# Patient Record
Sex: Female | Born: 1955 | Race: White | Hispanic: No | Marital: Married
Health system: Southern US, Community
[De-identification: ages and names within clinical notes are randomized; demographics above are authoritative.]

---

## 1998-11-30 ENCOUNTER — Other Ambulatory Visit: Admission: RE | Admit: 1998-11-30 | Discharge: 1998-11-30 | Payer: Self-pay | Admitting: Family Medicine

## 1999-11-25 ENCOUNTER — Emergency Department (HOSPITAL_COMMUNITY): Admission: EM | Admit: 1999-11-25 | Discharge: 1999-11-25 | Payer: Self-pay | Admitting: Emergency Medicine

## 1999-11-30 ENCOUNTER — Ambulatory Visit (HOSPITAL_COMMUNITY): Admission: RE | Admit: 1999-11-30 | Discharge: 1999-11-30 | Payer: Self-pay | Admitting: Psychiatry

## 1999-12-24 ENCOUNTER — Ambulatory Visit (HOSPITAL_COMMUNITY): Admission: RE | Admit: 1999-12-24 | Discharge: 1999-12-24 | Payer: Self-pay | Admitting: Psychiatry

## 2000-01-24 ENCOUNTER — Ambulatory Visit (HOSPITAL_COMMUNITY): Admission: RE | Admit: 2000-01-24 | Discharge: 2000-01-24 | Payer: Self-pay | Admitting: Psychiatry

## 2000-02-18 ENCOUNTER — Ambulatory Visit (HOSPITAL_COMMUNITY): Admission: RE | Admit: 2000-02-18 | Discharge: 2000-02-18 | Payer: Self-pay | Admitting: Psychiatry

## 2000-02-27 ENCOUNTER — Other Ambulatory Visit: Admission: RE | Admit: 2000-02-27 | Discharge: 2000-02-27 | Payer: Self-pay | Admitting: Obstetrics & Gynecology

## 2000-03-20 ENCOUNTER — Ambulatory Visit (HOSPITAL_COMMUNITY): Admission: RE | Admit: 2000-03-20 | Discharge: 2000-03-20 | Payer: Self-pay | Admitting: Psychiatry

## 2000-04-14 ENCOUNTER — Ambulatory Visit (HOSPITAL_COMMUNITY): Admission: RE | Admit: 2000-04-14 | Discharge: 2000-04-14 | Payer: Self-pay | Admitting: Psychiatry

## 2000-04-24 ENCOUNTER — Ambulatory Visit (HOSPITAL_COMMUNITY): Admission: RE | Admit: 2000-04-24 | Discharge: 2000-04-24 | Payer: Self-pay | Admitting: Psychiatry

## 2000-04-24 ENCOUNTER — Other Ambulatory Visit: Admission: RE | Admit: 2000-04-24 | Discharge: 2000-04-24 | Payer: Self-pay | Admitting: Obstetrics and Gynecology

## 2000-05-31 ENCOUNTER — Inpatient Hospital Stay (HOSPITAL_COMMUNITY): Admission: EM | Admit: 2000-05-31 | Discharge: 2000-06-02 | Payer: Self-pay | Admitting: *Deleted

## 2000-07-21 ENCOUNTER — Inpatient Hospital Stay (HOSPITAL_COMMUNITY): Admission: EM | Admit: 2000-07-21 | Discharge: 2000-07-24 | Payer: Self-pay | Admitting: *Deleted

## 2000-07-25 ENCOUNTER — Other Ambulatory Visit (HOSPITAL_COMMUNITY): Admission: RE | Admit: 2000-07-25 | Discharge: 2000-08-04 | Payer: Self-pay | Admitting: Psychiatry

## 2000-09-24 ENCOUNTER — Encounter: Admission: RE | Admit: 2000-09-24 | Discharge: 2000-09-24 | Payer: Self-pay | Admitting: Obstetrics and Gynecology

## 2000-09-24 ENCOUNTER — Encounter: Payer: Self-pay | Admitting: Obstetrics and Gynecology

## 2000-09-24 ENCOUNTER — Other Ambulatory Visit: Admission: RE | Admit: 2000-09-24 | Discharge: 2000-09-24 | Payer: Self-pay | Admitting: Obstetrics and Gynecology

## 2001-03-25 ENCOUNTER — Other Ambulatory Visit: Admission: RE | Admit: 2001-03-25 | Discharge: 2001-03-25 | Payer: Self-pay | Admitting: Obstetrics and Gynecology

## 2002-07-09 ENCOUNTER — Other Ambulatory Visit: Admission: RE | Admit: 2002-07-09 | Discharge: 2002-07-09 | Payer: Self-pay | Admitting: Obstetrics and Gynecology

## 2002-08-02 ENCOUNTER — Other Ambulatory Visit (HOSPITAL_COMMUNITY): Admission: RE | Admit: 2002-08-02 | Discharge: 2002-08-06 | Payer: Self-pay | Admitting: *Deleted

## 2003-02-09 ENCOUNTER — Ambulatory Visit (HOSPITAL_COMMUNITY): Admission: RE | Admit: 2003-02-09 | Discharge: 2003-02-09 | Payer: Self-pay | Admitting: Family Medicine

## 2003-04-13 ENCOUNTER — Encounter: Admission: RE | Admit: 2003-04-13 | Discharge: 2003-04-13 | Payer: Self-pay | Admitting: Family Medicine

## 2003-04-27 ENCOUNTER — Encounter: Admission: RE | Admit: 2003-04-27 | Discharge: 2003-04-27 | Payer: Self-pay | Admitting: Family Medicine

## 2003-05-11 ENCOUNTER — Encounter: Admission: RE | Admit: 2003-05-11 | Discharge: 2003-05-11 | Payer: Self-pay | Admitting: Family Medicine

## 2003-06-30 ENCOUNTER — Ambulatory Visit (HOSPITAL_COMMUNITY): Admission: RE | Admit: 2003-06-30 | Discharge: 2003-06-30 | Payer: Self-pay | Admitting: Internal Medicine

## 2003-10-21 ENCOUNTER — Other Ambulatory Visit: Admission: RE | Admit: 2003-10-21 | Discharge: 2003-10-21 | Payer: Self-pay | Admitting: Obstetrics and Gynecology

## 2004-03-13 ENCOUNTER — Emergency Department (HOSPITAL_COMMUNITY): Admission: EM | Admit: 2004-03-13 | Discharge: 2004-03-13 | Payer: Self-pay | Admitting: Emergency Medicine

## 2004-12-28 ENCOUNTER — Ambulatory Visit: Payer: Self-pay

## 2005-09-12 ENCOUNTER — Encounter: Admission: RE | Admit: 2005-09-12 | Discharge: 2005-09-12 | Payer: Self-pay | Admitting: Radiology

## 2005-09-23 ENCOUNTER — Encounter: Admission: RE | Admit: 2005-09-23 | Discharge: 2005-09-23 | Payer: Self-pay | Admitting: General Surgery

## 2005-09-25 ENCOUNTER — Ambulatory Visit (HOSPITAL_BASED_OUTPATIENT_CLINIC_OR_DEPARTMENT_OTHER): Admission: RE | Admit: 2005-09-25 | Discharge: 2005-09-25 | Payer: Self-pay | Admitting: General Surgery

## 2005-09-25 ENCOUNTER — Encounter (INDEPENDENT_AMBULATORY_CARE_PROVIDER_SITE_OTHER): Payer: Self-pay | Admitting: Specialist

## 2005-10-04 ENCOUNTER — Ambulatory Visit: Payer: Self-pay | Admitting: Oncology

## 2005-10-23 LAB — COMPREHENSIVE METABOLIC PANEL
ALT: 13 U/L (ref 0–40)
AST: 15 U/L (ref 0–37)
Albumin: 4.5 g/dL (ref 3.5–5.2)
BUN: 11 mg/dL (ref 6–23)
Calcium: 9.8 mg/dL (ref 8.4–10.5)
Chloride: 102 mEq/L (ref 96–112)
Potassium: 4 mEq/L (ref 3.5–5.3)
Sodium: 141 mEq/L (ref 135–145)
Total Protein: 7 g/dL (ref 6.0–8.3)

## 2005-10-23 LAB — CBC WITH DIFFERENTIAL/PLATELET
BASO%: 0.3 % (ref 0.0–2.0)
HCT: 41.7 % (ref 34.8–46.6)
HGB: 14.1 g/dL (ref 11.6–15.9)
MCHC: 33.9 g/dL (ref 32.0–36.0)
MONO#: 0.5 10*3/uL (ref 0.1–0.9)
NEUT%: 55.7 % (ref 39.6–76.8)
RDW: 12.4 % (ref 11.3–14.5)
WBC: 5.3 10*3/uL (ref 3.9–10.0)
lymph#: 1.8 10*3/uL (ref 0.9–3.3)

## 2005-10-29 ENCOUNTER — Ambulatory Visit (HOSPITAL_COMMUNITY): Admission: RE | Admit: 2005-10-29 | Discharge: 2005-10-29 | Payer: Self-pay | Admitting: Oncology

## 2005-10-30 ENCOUNTER — Ambulatory Visit (HOSPITAL_COMMUNITY): Admission: RE | Admit: 2005-10-30 | Discharge: 2005-10-30 | Payer: Self-pay | Admitting: Oncology

## 2005-10-31 ENCOUNTER — Encounter (INDEPENDENT_AMBULATORY_CARE_PROVIDER_SITE_OTHER): Payer: Self-pay | Admitting: *Deleted

## 2005-10-31 ENCOUNTER — Ambulatory Visit: Admission: RE | Admit: 2005-10-31 | Discharge: 2005-10-31 | Payer: Self-pay | Admitting: Oncology

## 2005-11-01 ENCOUNTER — Ambulatory Visit (HOSPITAL_BASED_OUTPATIENT_CLINIC_OR_DEPARTMENT_OTHER): Admission: RE | Admit: 2005-11-01 | Discharge: 2005-11-01 | Payer: Self-pay | Admitting: General Surgery

## 2005-11-19 ENCOUNTER — Ambulatory Visit: Payer: Self-pay | Admitting: Oncology

## 2005-11-29 LAB — CBC WITH DIFFERENTIAL/PLATELET
Basophils Absolute: 0 10*3/uL (ref 0.0–0.1)
Eosinophils Absolute: 0.1 10*3/uL (ref 0.0–0.5)
HGB: 13 g/dL (ref 11.6–15.9)
NEUT#: 0.4 10*3/uL — CL (ref 1.5–6.5)
RDW: 12.6 % (ref 11.3–14.5)
WBC: 2.1 10*3/uL — ABNORMAL LOW (ref 3.9–10.0)
lymph#: 1.4 10*3/uL (ref 0.9–3.3)

## 2005-12-06 LAB — CBC WITH DIFFERENTIAL/PLATELET
Basophils Absolute: 0.1 10*3/uL (ref 0.0–0.1)
Eosinophils Absolute: 0.1 10*3/uL (ref 0.0–0.5)
HGB: 14.4 g/dL (ref 11.6–15.9)
LYMPH%: 31.8 % (ref 14.0–48.0)
MCV: 91.4 fL (ref 81.0–101.0)
MONO%: 10.4 % (ref 0.0–13.0)
NEUT#: 3.7 10*3/uL (ref 1.5–6.5)
Platelets: 214 10*3/uL (ref 145–400)
RDW: 11.6 % (ref 11.3–14.5)

## 2005-12-12 LAB — CBC WITH DIFFERENTIAL/PLATELET
Basophils Absolute: 0 10*3/uL (ref 0.0–0.1)
Eosinophils Absolute: 0 10*3/uL (ref 0.0–0.5)
HCT: 35.9 % (ref 34.8–46.6)
LYMPH%: 29 % (ref 14.0–48.0)
MCV: 93.6 fL (ref 81.0–101.0)
MONO%: 0.7 % (ref 0.0–13.0)
NEUT#: 1.7 10*3/uL (ref 1.5–6.5)
NEUT%: 69.6 % (ref 39.6–76.8)
Platelets: 210 10*3/uL (ref 145–400)
RBC: 3.83 10*6/uL (ref 3.70–5.32)

## 2005-12-17 LAB — COMPREHENSIVE METABOLIC PANEL
ALT: 18 U/L (ref 0–40)
CO2: 29 mEq/L (ref 19–32)
Calcium: 9.9 mg/dL (ref 8.4–10.5)
Chloride: 108 mEq/L (ref 96–112)
Creatinine, Ser: 0.89 mg/dL (ref 0.40–1.20)
Glucose, Bld: 108 mg/dL — ABNORMAL HIGH (ref 70–99)
Total Bilirubin: 0.6 mg/dL (ref 0.3–1.2)
Total Protein: 6.7 g/dL (ref 6.0–8.3)

## 2005-12-17 LAB — CBC WITH DIFFERENTIAL/PLATELET
Basophils Absolute: 0 10*3/uL (ref 0.0–0.1)
Eosinophils Absolute: 0 10*3/uL (ref 0.0–0.5)
HCT: 37 % (ref 34.8–46.6)
HGB: 12.7 g/dL (ref 11.6–15.9)
LYMPH%: 14.3 % (ref 14.0–48.0)
MONO#: 0.8 10*3/uL (ref 0.1–0.9)
NEUT#: 9.7 10*3/uL — ABNORMAL HIGH (ref 1.5–6.5)
NEUT%: 78.5 % — ABNORMAL HIGH (ref 39.6–76.8)
Platelets: 319 10*3/uL (ref 145–400)
WBC: 12.3 10*3/uL — ABNORMAL HIGH (ref 3.9–10.0)
lymph#: 1.8 10*3/uL (ref 0.9–3.3)

## 2005-12-20 LAB — CBC WITH DIFFERENTIAL/PLATELET
BASO%: 1.2 % (ref 0.0–2.0)
Eosinophils Absolute: 0 10*3/uL (ref 0.0–0.5)
HCT: 37.4 % (ref 34.8–46.6)
LYMPH%: 24.1 % (ref 14.0–48.0)
MCHC: 35.8 g/dL (ref 32.0–36.0)
MONO#: 0.9 10*3/uL (ref 0.1–0.9)
NEUT#: 4.7 10*3/uL (ref 1.5–6.5)
Platelets: 198 10*3/uL (ref 145–400)
RBC: 4.12 10*6/uL (ref 3.70–5.32)
WBC: 7.5 10*3/uL (ref 3.9–10.0)
lymph#: 1.8 10*3/uL (ref 0.9–3.3)

## 2005-12-27 LAB — CBC WITH DIFFERENTIAL/PLATELET
Basophils Absolute: 0 10*3/uL (ref 0.0–0.1)
Eosinophils Absolute: 0 10*3/uL (ref 0.0–0.5)
HCT: 33.7 % — ABNORMAL LOW (ref 34.8–46.6)
HGB: 11.6 g/dL (ref 11.6–15.9)
LYMPH%: 18.2 % (ref 14.0–48.0)
MONO#: 0 10*3/uL — ABNORMAL LOW (ref 0.1–0.9)
NEUT%: 79.4 % — ABNORMAL HIGH (ref 39.6–76.8)
Platelets: 124 10*3/uL — ABNORMAL LOW (ref 145–400)
WBC: 2.1 10*3/uL — ABNORMAL LOW (ref 3.9–10.0)
lymph#: 0.4 10*3/uL — ABNORMAL LOW (ref 0.9–3.3)

## 2006-01-03 LAB — CBC WITH DIFFERENTIAL/PLATELET
Basophils Absolute: 0.1 10*3/uL (ref 0.0–0.1)
EOS%: 0.2 % (ref 0.0–7.0)
HCT: 36 % (ref 34.8–46.6)
HGB: 12.9 g/dL (ref 11.6–15.9)
LYMPH%: 14.1 % (ref 14.0–48.0)
MCH: 32.7 pg (ref 26.0–34.0)
MCHC: 35.7 g/dL (ref 32.0–36.0)
MCV: 91.4 fL (ref 81.0–101.0)
MONO%: 8.3 % (ref 0.0–13.0)
NEUT%: 76.2 % (ref 39.6–76.8)
Platelets: 204 10*3/uL (ref 145–400)
lymph#: 1 10*3/uL (ref 0.9–3.3)

## 2006-01-04 ENCOUNTER — Ambulatory Visit: Payer: Self-pay | Admitting: Oncology

## 2006-01-06 ENCOUNTER — Ambulatory Visit: Payer: Self-pay | Admitting: Oncology

## 2006-01-07 LAB — CBC WITH DIFFERENTIAL/PLATELET
BASO%: 0 % (ref 0.0–2.0)
EOS%: 0 % (ref 0.0–7.0)
HCT: 34.7 % — ABNORMAL LOW (ref 34.8–46.6)
LYMPH%: 1.2 % — ABNORMAL LOW (ref 14.0–48.0)
MCH: 33.5 pg (ref 26.0–34.0)
MCHC: 34.5 g/dL (ref 32.0–36.0)
NEUT%: 98.7 % — ABNORMAL HIGH (ref 39.6–76.8)
lymph#: 0.6 10*3/uL — ABNORMAL LOW (ref 0.9–3.3)

## 2006-01-17 LAB — CBC WITH DIFFERENTIAL/PLATELET
BASO%: 1.2 % (ref 0.0–2.0)
EOS%: 0.7 % (ref 0.0–7.0)
HGB: 11.8 g/dL (ref 11.6–15.9)
MCH: 33.6 pg (ref 26.0–34.0)
MCHC: 35.7 g/dL (ref 32.0–36.0)
RDW: 15.5 % — ABNORMAL HIGH (ref 11.3–14.5)
lymph#: 0.7 10*3/uL — ABNORMAL LOW (ref 0.9–3.3)

## 2006-02-07 LAB — CBC WITH DIFFERENTIAL/PLATELET
Basophils Absolute: 0.1 10*3/uL (ref 0.0–0.1)
EOS%: 2.1 % (ref 0.0–7.0)
Eosinophils Absolute: 0.2 10*3/uL (ref 0.0–0.5)
HGB: 10.8 g/dL — ABNORMAL LOW (ref 11.6–15.9)
LYMPH%: 9.3 % — ABNORMAL LOW (ref 14.0–48.0)
MCH: 34.1 pg — ABNORMAL HIGH (ref 26.0–34.0)
MCV: 100.3 fL (ref 81.0–101.0)
MONO%: 4.8 % (ref 0.0–13.0)
NEUT#: 8.7 10*3/uL — ABNORMAL HIGH (ref 1.5–6.5)
NEUT%: 82.9 % — ABNORMAL HIGH (ref 39.6–76.8)
Platelets: 177 10*3/uL (ref 145–400)
RDW: 17.8 % — ABNORMAL HIGH (ref 11.3–14.5)

## 2006-02-14 LAB — CBC WITH DIFFERENTIAL/PLATELET
BASO%: 0.8 % (ref 0.0–2.0)
EOS%: 2 % (ref 0.0–7.0)
Eosinophils Absolute: 0.1 10*3/uL (ref 0.0–0.5)
LYMPH%: 10.8 % — ABNORMAL LOW (ref 14.0–48.0)
MCH: 33.4 pg (ref 26.0–34.0)
MCHC: 34.5 g/dL (ref 32.0–36.0)
MCV: 96.7 fL (ref 81.0–101.0)
MONO%: 9.3 % (ref 0.0–13.0)
Platelets: 175 10*3/uL (ref 145–400)
RBC: 3.71 10*6/uL (ref 3.70–5.32)
RDW: 14.5 % (ref 11.3–14.5)

## 2006-02-21 ENCOUNTER — Ambulatory Visit: Payer: Self-pay | Admitting: Oncology

## 2006-02-21 LAB — CBC WITH DIFFERENTIAL/PLATELET
BASO%: 0.1 % (ref 0.0–2.0)
LYMPH%: 4.1 % — ABNORMAL LOW (ref 14.0–48.0)
MCHC: 33.6 g/dL (ref 32.0–36.0)
MONO#: 1.1 10*3/uL — ABNORMAL HIGH (ref 0.1–0.9)
NEUT#: 29.5 10*3/uL — ABNORMAL HIGH (ref 1.5–6.5)
Platelets: 193 10*3/uL (ref 145–400)
RBC: 3.52 10*6/uL — ABNORMAL LOW (ref 3.70–5.32)
RDW: 16.4 % — ABNORMAL HIGH (ref 11.3–14.5)
WBC: 32.2 10*3/uL — ABNORMAL HIGH (ref 3.9–10.0)

## 2006-02-28 LAB — CBC WITH DIFFERENTIAL/PLATELET
BASO%: 0.6 % (ref 0.0–2.0)
Eosinophils Absolute: 0.1 10*3/uL (ref 0.0–0.5)
HCT: 37.1 % (ref 34.8–46.6)
MCHC: 34.3 g/dL (ref 32.0–36.0)
MONO#: 0.6 10*3/uL (ref 0.1–0.9)
NEUT#: 7.1 10*3/uL — ABNORMAL HIGH (ref 1.5–6.5)
NEUT%: 83.5 % — ABNORMAL HIGH (ref 39.6–76.8)
RBC: 3.77 10*6/uL (ref 3.70–5.32)
WBC: 8.5 10*3/uL (ref 3.9–10.0)
lymph#: 0.7 10*3/uL — ABNORMAL LOW (ref 0.9–3.3)

## 2006-03-07 ENCOUNTER — Ambulatory Visit: Admission: RE | Admit: 2006-03-07 | Discharge: 2006-05-26 | Payer: Self-pay | Admitting: Radiation Oncology

## 2006-03-07 LAB — CBC WITH DIFFERENTIAL/PLATELET
BASO%: 0.4 % (ref 0.0–2.0)
HCT: 35.4 % (ref 34.8–46.6)
LYMPH%: 20.2 % (ref 14.0–48.0)
MCH: 33.3 pg (ref 26.0–34.0)
MCHC: 33.7 g/dL (ref 32.0–36.0)
MCV: 99 fL (ref 81.0–101.0)
MONO#: 0.3 10*3/uL (ref 0.1–0.9)
MONO%: 7.4 % (ref 0.0–13.0)
NEUT%: 69.3 % (ref 39.6–76.8)
Platelets: 201 10*3/uL (ref 145–400)

## 2006-04-18 ENCOUNTER — Ambulatory Visit: Payer: Self-pay | Admitting: Oncology

## 2006-04-22 LAB — CBC WITH DIFFERENTIAL/PLATELET
BASO%: 0.2 % (ref 0.0–2.0)
EOS%: 1.3 % (ref 0.0–7.0)
LYMPH%: 30.7 % (ref 14.0–48.0)
MCH: 32.5 pg (ref 26.0–34.0)
MCHC: 34.6 g/dL (ref 32.0–36.0)
MCV: 94.1 fL (ref 81.0–101.0)
MONO%: 9.7 % (ref 0.0–13.0)
NEUT#: 1.8 10*3/uL (ref 1.5–6.5)
Platelets: 222 10*3/uL (ref 145–400)
RBC: 4.6 10*6/uL (ref 3.70–5.32)
RDW: 12.4 % (ref 11.3–14.5)

## 2006-04-22 LAB — COMPREHENSIVE METABOLIC PANEL
AST: 16 U/L (ref 0–37)
Albumin: 4.6 g/dL (ref 3.5–5.2)
Alkaline Phosphatase: 59 U/L (ref 39–117)
Potassium: 4.1 mEq/L (ref 3.5–5.3)
Sodium: 144 mEq/L (ref 135–145)
Total Bilirubin: 0.3 mg/dL (ref 0.3–1.2)
Total Protein: 7.1 g/dL (ref 6.0–8.3)

## 2006-05-06 LAB — ESTRADIOL, ULTRA SENS: Estradiol, Ultra Sensitive: 17 pg/mL

## 2006-05-27 ENCOUNTER — Ambulatory Visit (HOSPITAL_COMMUNITY): Admission: RE | Admit: 2006-05-27 | Discharge: 2006-05-27 | Payer: Self-pay | Admitting: General Surgery

## 2006-05-29 ENCOUNTER — Ambulatory Visit: Admission: RE | Admit: 2006-05-29 | Discharge: 2006-08-27 | Payer: Self-pay | Admitting: Radiation Oncology

## 2006-06-02 ENCOUNTER — Ambulatory Visit: Payer: Self-pay | Admitting: Oncology

## 2006-06-02 LAB — CBC WITH DIFFERENTIAL/PLATELET
BASO%: 0.4 % (ref 0.0–2.0)
EOS%: 1.3 % (ref 0.0–7.0)
HCT: 43.8 % (ref 34.8–46.6)
LYMPH%: 25.4 % (ref 14.0–48.0)
MCH: 31.2 pg (ref 26.0–34.0)
MCHC: 34.6 g/dL (ref 32.0–36.0)
NEUT%: 61 % (ref 39.6–76.8)
Platelets: 224 10*3/uL (ref 145–400)
lymph#: 0.7 10*3/uL — ABNORMAL LOW (ref 0.9–3.3)

## 2006-06-02 LAB — COMPREHENSIVE METABOLIC PANEL
ALT: 18 U/L (ref 0–35)
AST: 17 U/L (ref 0–37)
Creatinine, Ser: 0.84 mg/dL (ref 0.40–1.20)
Total Bilirubin: 0.5 mg/dL (ref 0.3–1.2)

## 2006-06-02 LAB — CANCER ANTIGEN 27.29: CA 27.29: 17 U/mL (ref 0–39)

## 2006-07-11 LAB — ESTRADIOL, ULTRA SENS

## 2006-07-17 ENCOUNTER — Ambulatory Visit: Payer: Self-pay | Admitting: Oncology

## 2006-07-22 LAB — COMPREHENSIVE METABOLIC PANEL
ALT: 14 U/L (ref 0–35)
Alkaline Phosphatase: 61 U/L (ref 39–117)
CO2: 28 mEq/L (ref 19–32)
Creatinine, Ser: 0.85 mg/dL (ref 0.40–1.20)
Total Bilirubin: 0.5 mg/dL (ref 0.3–1.2)

## 2006-07-22 LAB — CBC WITH DIFFERENTIAL/PLATELET
BASO%: 0.3 % (ref 0.0–2.0)
EOS%: 1.4 % (ref 0.0–7.0)
HCT: 43 % (ref 34.8–46.6)
LYMPH%: 35.6 % (ref 14.0–48.0)
MCH: 32.3 pg (ref 26.0–34.0)
MCHC: 35.5 g/dL (ref 32.0–36.0)
MCV: 90.9 fL (ref 81.0–101.0)
MONO%: 10.1 % (ref 0.0–13.0)
NEUT%: 52.6 % (ref 39.6–76.8)
Platelets: 246 10*3/uL (ref 145–400)
RBC: 4.73 10*6/uL (ref 3.70–5.32)

## 2006-07-22 LAB — CANCER ANTIGEN 27.29: CA 27.29: 15 U/mL (ref 0–39)

## 2006-09-18 ENCOUNTER — Ambulatory Visit: Payer: Self-pay | Admitting: Oncology

## 2006-09-22 LAB — CBC WITH DIFFERENTIAL/PLATELET
BASO%: 0.9 % (ref 0.0–2.0)
HCT: 40.6 % (ref 34.8–46.6)
LYMPH%: 26.3 % (ref 14.0–48.0)
MCH: 32 pg (ref 26.0–34.0)
MCHC: 35 g/dL (ref 32.0–36.0)
MCV: 91.4 fL (ref 81.0–101.0)
MONO#: 0.4 10*3/uL (ref 0.1–0.9)
MONO%: 10 % (ref 0.0–13.0)
NEUT%: 60.3 % (ref 39.6–76.8)
Platelets: 237 10*3/uL (ref 145–400)

## 2006-09-22 LAB — COMPREHENSIVE METABOLIC PANEL
ALT: 18 U/L (ref 0–35)
Alkaline Phosphatase: 49 U/L (ref 39–117)
CO2: 26 mEq/L (ref 19–32)
Creatinine, Ser: 0.79 mg/dL (ref 0.40–1.20)
Total Bilirubin: 0.4 mg/dL (ref 0.3–1.2)

## 2006-09-22 LAB — CANCER ANTIGEN 27.29: CA 27.29: 16 U/mL (ref 0–39)

## 2006-09-22 LAB — FOLLICLE STIMULATING HORMONE: FSH: 61.6 m[IU]/mL

## 2006-10-13 ENCOUNTER — Ambulatory Visit: Payer: Self-pay | Admitting: Psychiatry

## 2006-10-22 ENCOUNTER — Ambulatory Visit: Payer: Self-pay | Admitting: Psychiatry

## 2006-11-03 ENCOUNTER — Ambulatory Visit: Payer: Self-pay | Admitting: Psychiatry

## 2006-11-11 ENCOUNTER — Ambulatory Visit: Payer: Self-pay | Admitting: Oncology

## 2007-01-19 ENCOUNTER — Ambulatory Visit: Payer: Self-pay | Admitting: Oncology

## 2007-01-29 ENCOUNTER — Encounter: Admission: RE | Admit: 2007-01-29 | Discharge: 2007-03-30 | Payer: Self-pay | Admitting: Oncology

## 2007-03-26 ENCOUNTER — Ambulatory Visit: Payer: Self-pay | Admitting: Oncology

## 2007-04-08 LAB — CBC WITH DIFFERENTIAL/PLATELET
BASO%: 0.3 % (ref 0.0–2.0)
EOS%: 1.5 % (ref 0.0–7.0)
MCH: 32.1 pg (ref 26.0–34.0)
MCV: 93.8 fL (ref 81.0–101.0)
MONO%: 8.5 % (ref 0.0–13.0)
RBC: 4.47 10*6/uL (ref 3.70–5.32)
RDW: 13 % (ref 11.3–14.5)

## 2007-04-09 LAB — COMPREHENSIVE METABOLIC PANEL
AST: 26 U/L (ref 0–37)
Albumin: 4.6 g/dL (ref 3.5–5.2)
Alkaline Phosphatase: 51 U/L (ref 39–117)
BUN: 12 mg/dL (ref 6–23)
Potassium: 4.5 mEq/L (ref 3.5–5.3)
Sodium: 142 mEq/L (ref 135–145)
Total Protein: 7.3 g/dL (ref 6.0–8.3)

## 2007-09-18 ENCOUNTER — Emergency Department (HOSPITAL_COMMUNITY): Admission: EM | Admit: 2007-09-18 | Discharge: 2007-09-19 | Payer: Self-pay | Admitting: Emergency Medicine

## 2007-09-18 ENCOUNTER — Ambulatory Visit: Payer: Self-pay | Admitting: Oncology

## 2007-09-18 ENCOUNTER — Ambulatory Visit (HOSPITAL_COMMUNITY): Admission: RE | Admit: 2007-09-18 | Discharge: 2007-09-18 | Payer: Self-pay | Admitting: Oncology

## 2007-09-18 LAB — COMPREHENSIVE METABOLIC PANEL
Albumin: 4 g/dL (ref 3.5–5.2)
BUN: 14 mg/dL (ref 6–23)
CO2: 29 mEq/L (ref 19–32)
Calcium: 8.9 mg/dL (ref 8.4–10.5)
Chloride: 102 mEq/L (ref 96–112)
Glucose, Bld: 98 mg/dL (ref 70–99)
Potassium: 4.1 mEq/L (ref 3.5–5.3)
Sodium: 139 mEq/L (ref 135–145)
Total Protein: 6.6 g/dL (ref 6.0–8.3)

## 2007-09-18 LAB — CBC WITH DIFFERENTIAL/PLATELET
Basophils Absolute: 0 10*3/uL (ref 0.0–0.1)
Eosinophils Absolute: 0 10*3/uL (ref 0.0–0.5)
HGB: 14.2 g/dL (ref 11.6–15.9)
MONO#: 0.2 10*3/uL (ref 0.1–0.9)
NEUT#: 1.9 10*3/uL (ref 1.5–6.5)
RBC: 4.38 10*6/uL (ref 3.70–5.32)
RDW: 13.6 % (ref 11.3–14.5)
WBC: 3.4 10*3/uL — ABNORMAL LOW (ref 3.9–10.0)

## 2007-10-01 ENCOUNTER — Encounter: Admission: RE | Admit: 2007-10-01 | Discharge: 2007-10-21 | Payer: Self-pay | Admitting: Oncology

## 2007-11-25 ENCOUNTER — Ambulatory Visit: Payer: Self-pay | Admitting: Psychiatry

## 2007-12-01 ENCOUNTER — Ambulatory Visit: Payer: Self-pay | Admitting: Psychiatry

## 2007-12-22 ENCOUNTER — Ambulatory Visit: Payer: Self-pay | Admitting: Psychiatry

## 2008-01-05 ENCOUNTER — Ambulatory Visit: Payer: Self-pay | Admitting: Psychiatry

## 2008-05-16 ENCOUNTER — Ambulatory Visit: Payer: Self-pay | Admitting: Psychiatry

## 2008-07-14 ENCOUNTER — Ambulatory Visit: Payer: Self-pay | Admitting: Oncology

## 2008-07-18 LAB — CBC WITH DIFFERENTIAL/PLATELET
Basophils Absolute: 0 10*3/uL (ref 0.0–0.1)
EOS%: 0.9 % (ref 0.0–7.0)
Eosinophils Absolute: 0 10*3/uL (ref 0.0–0.5)
HCT: 41.6 % (ref 34.8–46.6)
HGB: 14.2 g/dL (ref 11.6–15.9)
MCH: 32.7 pg (ref 25.1–34.0)
MCV: 95.4 fL (ref 79.5–101.0)
MONO%: 7.6 % (ref 0.0–14.0)
NEUT#: 1.8 10*3/uL (ref 1.5–6.5)
NEUT%: 54.5 % (ref 38.4–76.8)
RDW: 13.3 % (ref 11.2–14.5)

## 2008-07-18 LAB — COMPREHENSIVE METABOLIC PANEL
AST: 16 U/L (ref 0–37)
Albumin: 4.4 g/dL (ref 3.5–5.2)
Alkaline Phosphatase: 54 U/L (ref 39–117)
BUN: 13 mg/dL (ref 6–23)
Calcium: 9.4 mg/dL (ref 8.4–10.5)
Chloride: 108 mEq/L (ref 96–112)
Creatinine, Ser: 0.82 mg/dL (ref 0.40–1.20)
Glucose, Bld: 90 mg/dL (ref 70–99)
Potassium: 4.1 mEq/L (ref 3.5–5.3)

## 2008-07-20 ENCOUNTER — Ambulatory Visit (HOSPITAL_COMMUNITY): Admission: RE | Admit: 2008-07-20 | Discharge: 2008-07-20 | Payer: Self-pay | Admitting: Oncology

## 2008-07-26 ENCOUNTER — Ambulatory Visit (HOSPITAL_COMMUNITY): Admission: RE | Admit: 2008-07-26 | Discharge: 2008-07-26 | Payer: Self-pay | Admitting: Oncology

## 2008-08-08 ENCOUNTER — Ambulatory Visit (HOSPITAL_COMMUNITY): Admission: RE | Admit: 2008-08-08 | Discharge: 2008-08-08 | Payer: Self-pay | Admitting: Oncology

## 2008-08-08 ENCOUNTER — Encounter (INDEPENDENT_AMBULATORY_CARE_PROVIDER_SITE_OTHER): Payer: Self-pay | Admitting: Interventional Radiology

## 2008-09-01 ENCOUNTER — Ambulatory Visit: Payer: Self-pay | Admitting: Oncology

## 2008-09-02 ENCOUNTER — Ambulatory Visit (HOSPITAL_COMMUNITY): Admission: RE | Admit: 2008-09-02 | Discharge: 2008-09-02 | Payer: Self-pay | Admitting: Oncology

## 2008-09-06 LAB — CBC WITH DIFFERENTIAL/PLATELET
BASO%: 0.3 % (ref 0.0–2.0)
EOS%: 1.5 % (ref 0.0–7.0)
HCT: 41.2 % (ref 34.8–46.6)
MCH: 32.8 pg (ref 25.1–34.0)
MCHC: 34.5 g/dL (ref 31.5–36.0)
MCV: 95.2 fL (ref 79.5–101.0)
MONO%: 10.8 % (ref 0.0–14.0)
NEUT%: 54.9 % (ref 38.4–76.8)
lymph#: 1.1 10*3/uL (ref 0.9–3.3)

## 2008-09-07 LAB — COMPREHENSIVE METABOLIC PANEL
Alkaline Phosphatase: 57 U/L (ref 39–117)
Glucose, Bld: 61 mg/dL — ABNORMAL LOW (ref 70–99)
Sodium: 142 mEq/L (ref 135–145)
Total Bilirubin: 0.3 mg/dL (ref 0.3–1.2)
Total Protein: 7.1 g/dL (ref 6.0–8.3)

## 2008-09-09 ENCOUNTER — Ambulatory Visit (HOSPITAL_COMMUNITY): Admission: RE | Admit: 2008-09-09 | Discharge: 2008-09-09 | Payer: Self-pay | Admitting: Oncology

## 2008-09-19 ENCOUNTER — Ambulatory Visit (HOSPITAL_COMMUNITY): Admission: RE | Admit: 2008-09-19 | Discharge: 2008-09-19 | Payer: Self-pay | Admitting: Oncology

## 2008-09-20 LAB — COMPREHENSIVE METABOLIC PANEL
BUN: 8 mg/dL (ref 6–23)
CO2: 28 mEq/L (ref 19–32)
Creatinine, Ser: 0.84 mg/dL (ref 0.40–1.20)
Glucose, Bld: 83 mg/dL (ref 70–99)
Total Bilirubin: 0.4 mg/dL (ref 0.3–1.2)

## 2008-09-20 LAB — CBC WITH DIFFERENTIAL/PLATELET
BASO%: 1.3 % (ref 0.0–2.0)
Eosinophils Absolute: 0 10*3/uL (ref 0.0–0.5)
HCT: 38.2 % (ref 34.8–46.6)
LYMPH%: 70.9 % — ABNORMAL HIGH (ref 14.0–49.7)
MCHC: 34 g/dL (ref 31.5–36.0)
MONO#: 0 10*3/uL — ABNORMAL LOW (ref 0.1–0.9)
NEUT%: 22.7 % — ABNORMAL LOW (ref 38.4–76.8)
Platelets: 138 10*3/uL — ABNORMAL LOW (ref 145–400)
WBC: 0.8 10*3/uL — CL (ref 3.9–10.3)

## 2008-09-23 LAB — CBC WITH DIFFERENTIAL/PLATELET
Eosinophils Absolute: 0 10*3/uL (ref 0.0–0.5)
HCT: 35.6 % (ref 34.8–46.6)
HGB: 12.7 g/dL (ref 11.6–15.9)
LYMPH%: 67.4 % — ABNORMAL HIGH (ref 14.0–49.7)
MONO#: 0.1 10*3/uL (ref 0.1–0.9)
NEUT#: 0.4 10*3/uL — CL (ref 1.5–6.5)
NEUT%: 22.9 % — ABNORMAL LOW (ref 38.4–76.8)
Platelets: 205 10*3/uL (ref 145–400)
WBC: 1.8 10*3/uL — ABNORMAL LOW (ref 3.9–10.3)

## 2008-10-04 ENCOUNTER — Ambulatory Visit: Payer: Self-pay | Admitting: Oncology

## 2008-10-04 LAB — CBC WITH DIFFERENTIAL/PLATELET
Basophils Absolute: 0 10*3/uL (ref 0.0–0.1)
Eosinophils Absolute: 0 10*3/uL (ref 0.0–0.5)
HCT: 41.5 % (ref 34.8–46.6)
HGB: 14.1 g/dL (ref 11.6–15.9)
LYMPH%: 36 % (ref 14.0–49.7)
MCHC: 34 g/dL (ref 31.5–36.0)
MONO#: 0.6 10*3/uL (ref 0.1–0.9)
NEUT#: 2.4 10*3/uL (ref 1.5–6.5)
NEUT%: 50.7 % (ref 38.4–76.8)
Platelets: 267 10*3/uL (ref 145–400)
WBC: 4.6 10*3/uL (ref 3.9–10.3)
lymph#: 1.7 10*3/uL (ref 0.9–3.3)

## 2008-10-12 LAB — CBC WITH DIFFERENTIAL/PLATELET
BASO%: 0.2 % (ref 0.0–2.0)
Eosinophils Absolute: 0.1 10*3/uL (ref 0.0–0.5)
HCT: 38.3 % (ref 34.8–46.6)
LYMPH%: 14.4 % (ref 14.0–49.7)
MCHC: 33.8 g/dL (ref 31.5–36.0)
MCV: 96.2 fL (ref 79.5–101.0)
MONO#: 0.7 10*3/uL (ref 0.1–0.9)
NEUT%: 80.4 % — ABNORMAL HIGH (ref 38.4–76.8)
Platelets: 186 10*3/uL (ref 145–400)
WBC: 14.3 10*3/uL — ABNORMAL HIGH (ref 3.9–10.3)

## 2008-10-25 LAB — CBC WITH DIFFERENTIAL/PLATELET
BASO%: 0.8 % (ref 0.0–2.0)
EOS%: 0.8 % (ref 0.0–7.0)
HCT: 37 % (ref 34.8–46.6)
LYMPH%: 26.9 % (ref 14.0–49.7)
MCH: 31.9 pg (ref 25.1–34.0)
MCHC: 34.1 g/dL (ref 31.5–36.0)
NEUT%: 57.5 % (ref 38.4–76.8)
Platelets: 293 10*3/uL (ref 145–400)

## 2008-10-25 LAB — COMPREHENSIVE METABOLIC PANEL
ALT: 22 U/L (ref 0–35)
AST: 23 U/L (ref 0–37)
Creatinine, Ser: 0.74 mg/dL (ref 0.40–1.20)
Total Bilirubin: 0.6 mg/dL (ref 0.3–1.2)

## 2008-10-28 ENCOUNTER — Ambulatory Visit: Payer: Self-pay | Admitting: Oncology

## 2008-11-01 LAB — CBC WITH DIFFERENTIAL/PLATELET
BASO%: 0.4 % (ref 0.0–2.0)
EOS%: 1.1 % (ref 0.0–7.0)
HCT: 36.2 % (ref 34.8–46.6)
LYMPH%: 25.3 % (ref 14.0–49.7)
MCH: 31.4 pg (ref 25.1–34.0)
MCHC: 34 g/dL (ref 31.5–36.0)
MONO#: 0.8 10*3/uL (ref 0.1–0.9)
NEUT%: 62.7 % (ref 38.4–76.8)
Platelets: 216 10*3/uL (ref 145–400)

## 2008-11-15 LAB — COMPREHENSIVE METABOLIC PANEL
ALT: 23 U/L (ref 0–35)
Albumin: 3.7 g/dL (ref 3.5–5.2)
Alkaline Phosphatase: 44 U/L (ref 39–117)
CO2: 26 mEq/L (ref 19–32)
Potassium: 3.8 mEq/L (ref 3.5–5.3)
Sodium: 136 mEq/L (ref 135–145)
Total Bilirubin: 0.6 mg/dL (ref 0.3–1.2)
Total Protein: 6.3 g/dL (ref 6.0–8.3)

## 2008-11-15 LAB — CBC WITH DIFFERENTIAL/PLATELET
BASO%: 1 % (ref 0.0–2.0)
Eosinophils Absolute: 0 10*3/uL (ref 0.0–0.5)
LYMPH%: 31.3 % (ref 14.0–49.7)
MCHC: 33.6 g/dL (ref 31.5–36.0)
MONO#: 0.3 10*3/uL (ref 0.1–0.9)
MONO%: 11 % (ref 0.0–14.0)
NEUT#: 1.7 10*3/uL (ref 1.5–6.5)
Platelets: 256 10*3/uL (ref 145–400)
RBC: 4.21 10*6/uL (ref 3.70–5.45)
RDW: 15.1 % — ABNORMAL HIGH (ref 11.2–14.5)
WBC: 3.1 10*3/uL — ABNORMAL LOW (ref 3.9–10.3)

## 2008-11-22 ENCOUNTER — Ambulatory Visit (HOSPITAL_COMMUNITY): Admission: RE | Admit: 2008-11-22 | Discharge: 2008-11-22 | Payer: Self-pay | Admitting: Oncology

## 2008-11-22 LAB — CBC WITH DIFFERENTIAL/PLATELET
BASO%: 0.2 % (ref 0.0–2.0)
EOS%: 0.6 % (ref 0.0–7.0)
HCT: 37.2 % (ref 34.8–46.6)
LYMPH%: 16.4 % (ref 14.0–49.7)
MCH: 32.8 pg (ref 25.1–34.0)
MCHC: 33.6 g/dL (ref 31.5–36.0)
MONO%: 2.8 % (ref 0.0–14.0)
NEUT%: 80 % — ABNORMAL HIGH (ref 38.4–76.8)
Platelets: 199 10*3/uL (ref 145–400)
RBC: 3.81 10*6/uL (ref 3.70–5.45)

## 2008-11-25 ENCOUNTER — Ambulatory Visit: Payer: Self-pay | Admitting: Oncology

## 2008-11-29 LAB — CBC WITH DIFFERENTIAL/PLATELET
BASO%: 0.3 % (ref 0.0–2.0)
EOS%: 1.3 % (ref 0.0–7.0)
MCH: 33.2 pg (ref 25.1–34.0)
MCHC: 34.3 g/dL (ref 31.5–36.0)
NEUT%: 75.9 % (ref 38.4–76.8)
RBC: 3.81 10*6/uL (ref 3.70–5.45)
RDW: 15.6 % — ABNORMAL HIGH (ref 11.2–14.5)
WBC: 8.6 10*3/uL (ref 3.9–10.3)
lymph#: 1.4 10*3/uL (ref 0.9–3.3)

## 2008-11-29 LAB — COMPREHENSIVE METABOLIC PANEL
ALT: 34 U/L (ref 0–35)
AST: 30 U/L (ref 0–37)
Alkaline Phosphatase: 60 U/L (ref 39–117)
CO2: 27 mEq/L (ref 19–32)
Creatinine, Ser: 0.91 mg/dL (ref 0.40–1.20)
Total Bilirubin: 0.4 mg/dL (ref 0.3–1.2)

## 2008-12-23 ENCOUNTER — Ambulatory Visit: Payer: Self-pay | Admitting: Oncology

## 2008-12-27 LAB — CBC WITH DIFFERENTIAL/PLATELET
Basophils Absolute: 0 10*3/uL (ref 0.0–0.1)
HCT: 41.8 % (ref 34.8–46.6)
HGB: 14.4 g/dL (ref 11.6–15.9)
MCH: 33.2 pg (ref 25.1–34.0)
MONO#: 0.4 10*3/uL (ref 0.1–0.9)
NEUT%: 60.7 % (ref 38.4–76.8)
lymph#: 1.2 10*3/uL (ref 0.9–3.3)

## 2008-12-27 LAB — COMPREHENSIVE METABOLIC PANEL
BUN: 13 mg/dL (ref 6–23)
CO2: 29 mEq/L (ref 19–32)
Calcium: 9.8 mg/dL (ref 8.4–10.5)
Chloride: 103 mEq/L (ref 96–112)
Creatinine, Ser: 0.9 mg/dL (ref 0.40–1.20)

## 2008-12-27 LAB — CANCER ANTIGEN 27.29: CA 27.29: 29 U/mL (ref 0–39)

## 2008-12-30 ENCOUNTER — Ambulatory Visit (HOSPITAL_COMMUNITY): Admission: RE | Admit: 2008-12-30 | Discharge: 2008-12-30 | Payer: Self-pay | Admitting: Oncology

## 2009-01-11 ENCOUNTER — Ambulatory Visit: Admission: RE | Admit: 2009-01-11 | Discharge: 2009-03-22 | Payer: Self-pay | Admitting: Radiation Oncology

## 2009-01-14 ENCOUNTER — Ambulatory Visit (HOSPITAL_COMMUNITY): Admission: RE | Admit: 2009-01-14 | Discharge: 2009-01-14 | Payer: Self-pay | Admitting: Radiation Oncology

## 2009-01-25 ENCOUNTER — Ambulatory Visit: Payer: Self-pay | Admitting: Oncology

## 2009-02-02 ENCOUNTER — Ambulatory Visit (HOSPITAL_COMMUNITY): Admission: RE | Admit: 2009-02-02 | Discharge: 2009-02-02 | Payer: Self-pay | Admitting: Oncology

## 2009-02-06 LAB — CBC WITH DIFFERENTIAL/PLATELET
Basophils Absolute: 0 10*3/uL (ref 0.0–0.1)
EOS%: 1.7 % (ref 0.0–7.0)
Eosinophils Absolute: 0.1 10*3/uL (ref 0.0–0.5)
HGB: 14.7 g/dL (ref 11.6–15.9)
LYMPH%: 41.1 % (ref 14.0–49.7)
MCH: 31.5 pg (ref 25.1–34.0)
MCV: 93.1 fL (ref 79.5–101.0)
MONO%: 13.6 % (ref 0.0–14.0)
NEUT%: 43.3 % (ref 38.4–76.8)
Platelets: 229 10*3/uL (ref 145–400)
RDW: 12.1 % (ref 11.2–14.5)

## 2009-02-06 LAB — BASIC METABOLIC PANEL
BUN: 11 mg/dL (ref 6–23)
Calcium: 9.5 mg/dL (ref 8.4–10.5)
Creatinine, Ser: 0.74 mg/dL (ref 0.40–1.20)
Glucose, Bld: 88 mg/dL (ref 70–99)
Potassium: 3.8 mEq/L (ref 3.5–5.3)

## 2009-02-13 LAB — COMPREHENSIVE METABOLIC PANEL
ALT: 48 U/L — ABNORMAL HIGH (ref 0–35)
Albumin: 3.8 g/dL (ref 3.5–5.2)
Alkaline Phosphatase: 95 U/L (ref 39–117)
CO2: 27 mEq/L (ref 19–32)
Glucose, Bld: 124 mg/dL — ABNORMAL HIGH (ref 70–99)
Potassium: 3.9 mEq/L (ref 3.5–5.3)
Sodium: 136 mEq/L (ref 135–145)
Total Bilirubin: 0.6 mg/dL (ref 0.3–1.2)
Total Protein: 7.2 g/dL (ref 6.0–8.3)

## 2009-02-13 LAB — CANCER ANTIGEN 27.29: CA 27.29: 24 U/mL (ref 0–39)

## 2009-02-13 LAB — CBC WITH DIFFERENTIAL/PLATELET
BASO%: 0.1 % (ref 0.0–2.0)
Eosinophils Absolute: 0 10*3/uL (ref 0.0–0.5)
LYMPH%: 20.5 % (ref 14.0–49.7)
MCHC: 33.9 g/dL (ref 31.5–36.0)
MONO#: 0.4 10*3/uL (ref 0.1–0.9)
NEUT#: 3.7 10*3/uL (ref 1.5–6.5)
RBC: 4.5 10*6/uL (ref 3.70–5.45)
RDW: 12.3 % (ref 11.2–14.5)
WBC: 5.2 10*3/uL (ref 3.9–10.3)
lymph#: 1.1 10*3/uL (ref 0.9–3.3)

## 2009-02-14 ENCOUNTER — Ambulatory Visit (HOSPITAL_COMMUNITY): Admission: RE | Admit: 2009-02-14 | Discharge: 2009-02-14 | Payer: Self-pay | Admitting: Oncology

## 2009-02-15 ENCOUNTER — Ambulatory Visit: Payer: Self-pay | Admitting: Oncology

## 2009-02-15 ENCOUNTER — Inpatient Hospital Stay (HOSPITAL_COMMUNITY): Admission: EM | Admit: 2009-02-15 | Discharge: 2009-02-16 | Payer: Self-pay | Admitting: Emergency Medicine

## 2009-02-20 LAB — CBC WITH DIFFERENTIAL/PLATELET
BASO%: 0.3 % (ref 0.0–2.0)
EOS%: 0.5 % (ref 0.0–7.0)
HCT: 40.1 % (ref 34.8–46.6)
LYMPH%: 26.2 % (ref 14.0–49.7)
MCH: 32.1 pg (ref 25.1–34.0)
MCHC: 34 g/dL (ref 31.5–36.0)
NEUT%: 69.9 % (ref 38.4–76.8)
Platelets: 157 10*3/uL (ref 145–400)
RBC: 4.24 10*6/uL (ref 3.70–5.45)
WBC: 3.6 10*3/uL — ABNORMAL LOW (ref 3.9–10.3)

## 2009-02-20 LAB — COMPREHENSIVE METABOLIC PANEL
ALT: 121 U/L — ABNORMAL HIGH (ref 0–35)
AST: 66 U/L — ABNORMAL HIGH (ref 0–37)
Alkaline Phosphatase: 77 U/L (ref 39–117)
Creatinine, Ser: 1.03 mg/dL (ref 0.40–1.20)
Sodium: 138 mEq/L (ref 135–145)
Total Bilirubin: 0.4 mg/dL (ref 0.3–1.2)
Total Protein: 6.5 g/dL (ref 6.0–8.3)

## 2009-02-21 ENCOUNTER — Ambulatory Visit: Payer: Self-pay | Admitting: Oncology

## 2009-02-22 ENCOUNTER — Emergency Department (HOSPITAL_COMMUNITY): Admission: EM | Admit: 2009-02-22 | Discharge: 2009-02-22 | Payer: Self-pay | Admitting: Emergency Medicine

## 2009-03-06 LAB — COMPREHENSIVE METABOLIC PANEL
AST: 52 U/L — ABNORMAL HIGH (ref 0–37)
BUN: 9 mg/dL (ref 6–23)
Calcium: 8.6 mg/dL (ref 8.4–10.5)
Chloride: 108 mEq/L (ref 96–112)
Creatinine, Ser: 0.76 mg/dL (ref 0.40–1.20)
Total Bilirubin: 0.5 mg/dL (ref 0.3–1.2)

## 2009-03-06 LAB — CBC WITH DIFFERENTIAL/PLATELET
BASO%: 0.4 % (ref 0.0–2.0)
EOS%: 0.7 % (ref 0.0–7.0)
HCT: 37.6 % (ref 34.8–46.6)
LYMPH%: 19.4 % (ref 14.0–49.7)
MCH: 31 pg (ref 25.1–34.0)
MCHC: 33.5 g/dL (ref 31.5–36.0)
MCV: 92.4 fL (ref 79.5–101.0)
MONO#: 0.6 10*3/uL (ref 0.1–0.9)
MONO%: 23 % — ABNORMAL HIGH (ref 0.0–14.0)
NEUT%: 56.5 % (ref 38.4–76.8)
Platelets: 377 10*3/uL (ref 145–400)

## 2009-03-13 LAB — CBC WITH DIFFERENTIAL/PLATELET
Eosinophils Absolute: 0 10*3/uL (ref 0.0–0.5)
HCT: 39 % (ref 34.8–46.6)
HGB: 13.4 g/dL (ref 11.6–15.9)
LYMPH%: 13.9 % — ABNORMAL LOW (ref 14.0–49.7)
MONO#: 0.2 10*3/uL (ref 0.1–0.9)
NEUT#: 2.3 10*3/uL (ref 1.5–6.5)
NEUT%: 79 % — ABNORMAL HIGH (ref 38.4–76.8)
Platelets: 257 10*3/uL (ref 145–400)
RBC: 4.19 10*6/uL (ref 3.70–5.45)
WBC: 3 10*3/uL — ABNORMAL LOW (ref 3.9–10.3)

## 2009-03-13 LAB — COMPREHENSIVE METABOLIC PANEL
CO2: 26 mEq/L (ref 19–32)
Glucose, Bld: 107 mg/dL — ABNORMAL HIGH (ref 70–99)
Sodium: 136 mEq/L (ref 135–145)
Total Bilirubin: 0.3 mg/dL (ref 0.3–1.2)
Total Protein: 6.8 g/dL (ref 6.0–8.3)

## 2009-03-13 LAB — CANCER ANTIGEN 27.29: CA 27.29: 40 U/mL — ABNORMAL HIGH (ref 0–39)

## 2009-03-15 LAB — CLOSTRIDIUM DIFFICILE EIA

## 2009-03-23 ENCOUNTER — Ambulatory Visit: Payer: Self-pay | Admitting: Oncology

## 2009-03-27 LAB — CBC WITH DIFFERENTIAL/PLATELET
BASO%: 0.3 % (ref 0.0–2.0)
Basophils Absolute: 0 10*3/uL (ref 0.0–0.1)
EOS%: 1 % (ref 0.0–7.0)
HGB: 12 g/dL (ref 11.6–15.9)
MCH: 31.1 pg (ref 25.1–34.0)
MCHC: 33.1 g/dL (ref 31.5–36.0)
RBC: 3.86 10*6/uL (ref 3.70–5.45)
RDW: 15.2 % — ABNORMAL HIGH (ref 11.2–14.5)
lymph#: 0.7 10*3/uL — ABNORMAL LOW (ref 0.9–3.3)
nRBC: 0 % (ref 0–0)

## 2009-03-27 LAB — COMPREHENSIVE METABOLIC PANEL
AST: 49 U/L — ABNORMAL HIGH (ref 0–37)
Albumin: 3.3 g/dL — ABNORMAL LOW (ref 3.5–5.2)
Alkaline Phosphatase: 79 U/L (ref 39–117)
Potassium: 4 mEq/L (ref 3.5–5.3)
Sodium: 137 mEq/L (ref 135–145)
Total Protein: 6.3 g/dL (ref 6.0–8.3)

## 2009-04-03 LAB — COMPREHENSIVE METABOLIC PANEL
AST: 56 U/L — ABNORMAL HIGH (ref 0–37)
Albumin: 3.7 g/dL (ref 3.5–5.2)
Alkaline Phosphatase: 84 U/L (ref 39–117)
BUN: 10 mg/dL (ref 6–23)
Potassium: 3.9 mEq/L (ref 3.5–5.3)
Sodium: 137 mEq/L (ref 135–145)
Total Bilirubin: 0.4 mg/dL (ref 0.3–1.2)

## 2009-04-03 LAB — CBC WITH DIFFERENTIAL/PLATELET
Basophils Absolute: 0 10*3/uL (ref 0.0–0.1)
EOS%: 0.4 % (ref 0.0–7.0)
LYMPH%: 11.9 % — ABNORMAL LOW (ref 14.0–49.7)
MCH: 32.1 pg (ref 25.1–34.0)
MCV: 93 fL (ref 79.5–101.0)
MONO%: 9.4 % (ref 0.0–14.0)
Platelets: 124 10*3/uL — ABNORMAL LOW (ref 145–400)
RBC: 3.86 10*6/uL (ref 3.70–5.45)
RDW: 15.1 % — ABNORMAL HIGH (ref 11.2–14.5)

## 2009-04-17 ENCOUNTER — Ambulatory Visit (HOSPITAL_COMMUNITY): Admission: RE | Admit: 2009-04-17 | Discharge: 2009-04-17 | Payer: Self-pay | Admitting: Oncology

## 2009-04-24 ENCOUNTER — Ambulatory Visit: Payer: Self-pay | Admitting: Oncology

## 2009-04-24 LAB — COMPREHENSIVE METABOLIC PANEL
ALT: 28 U/L (ref 0–35)
AST: 47 U/L — ABNORMAL HIGH (ref 0–37)
Albumin: 3.6 g/dL (ref 3.5–5.2)
Alkaline Phosphatase: 90 U/L (ref 39–117)
BUN: 7 mg/dL (ref 6–23)
Potassium: 4.3 mEq/L (ref 3.5–5.3)

## 2009-04-24 LAB — CBC WITH DIFFERENTIAL/PLATELET
BASO%: 0.6 % (ref 0.0–2.0)
Basophils Absolute: 0 10*3/uL (ref 0.0–0.1)
EOS%: 0.6 % (ref 0.0–7.0)
HGB: 11.9 g/dL (ref 11.6–15.9)
MCH: 30.7 pg (ref 25.1–34.0)
MCHC: 32.2 g/dL (ref 31.5–36.0)
MCV: 95.4 fL (ref 79.5–101.0)
MONO%: 34.3 % — ABNORMAL HIGH (ref 0.0–14.0)
RBC: 3.88 10*6/uL (ref 3.70–5.45)
RDW: 17.2 % — ABNORMAL HIGH (ref 11.2–14.5)
lymph#: 0.7 10*3/uL — ABNORMAL LOW (ref 0.9–3.3)

## 2009-04-26 ENCOUNTER — Ambulatory Visit (HOSPITAL_COMMUNITY): Admission: RE | Admit: 2009-04-26 | Discharge: 2009-04-26 | Payer: Self-pay | Admitting: Oncology

## 2009-04-27 LAB — CBC WITH DIFFERENTIAL/PLATELET
Basophils Absolute: 0 10*3/uL (ref 0.0–0.1)
EOS%: 0.2 % (ref 0.0–7.0)
HCT: 35.3 % (ref 34.8–46.6)
HGB: 12 g/dL (ref 11.6–15.9)
MCH: 32.9 pg (ref 25.1–34.0)
MCHC: 34 g/dL (ref 31.5–36.0)
MCV: 96.8 fL (ref 79.5–101.0)
MONO%: 11.2 % (ref 0.0–14.0)
NEUT%: 80.1 % — ABNORMAL HIGH (ref 38.4–76.8)
RDW: 19 % — ABNORMAL HIGH (ref 11.2–14.5)

## 2009-05-01 LAB — COMPREHENSIVE METABOLIC PANEL
ALT: 48 U/L — ABNORMAL HIGH (ref 0–35)
Alkaline Phosphatase: 108 U/L (ref 39–117)
Glucose, Bld: 126 mg/dL — ABNORMAL HIGH (ref 70–99)
Sodium: 138 mEq/L (ref 135–145)
Total Bilirubin: 0.3 mg/dL (ref 0.3–1.2)
Total Protein: 6.5 g/dL (ref 6.0–8.3)

## 2009-05-01 LAB — CBC WITH DIFFERENTIAL/PLATELET
BASO%: 2.4 % — ABNORMAL HIGH (ref 0.0–2.0)
EOS%: 0.5 % (ref 0.0–7.0)
MCH: 31.2 pg (ref 25.1–34.0)
MCHC: 32.6 g/dL (ref 31.5–36.0)
MCV: 95.8 fL (ref 79.5–101.0)
MONO%: 16.7 % — ABNORMAL HIGH (ref 0.0–14.0)
RBC: 4.07 10*6/uL (ref 3.70–5.45)
RDW: 16.7 % — ABNORMAL HIGH (ref 11.2–14.5)
nRBC: 0 % (ref 0–0)

## 2009-05-04 ENCOUNTER — Ambulatory Visit (HOSPITAL_COMMUNITY): Admission: RE | Admit: 2009-05-04 | Discharge: 2009-05-04 | Payer: Self-pay | Admitting: Oncology

## 2009-05-05 ENCOUNTER — Ambulatory Visit: Admission: RE | Admit: 2009-05-05 | Discharge: 2009-06-08 | Payer: Self-pay | Admitting: Radiation Oncology

## 2009-05-05 ENCOUNTER — Ambulatory Visit (HOSPITAL_COMMUNITY): Admission: RE | Admit: 2009-05-05 | Discharge: 2009-05-05 | Payer: Self-pay | Admitting: Oncology

## 2009-05-08 LAB — CBC WITH DIFFERENTIAL/PLATELET
Basophils Absolute: 0 10*3/uL (ref 0.0–0.1)
EOS%: 0.5 % (ref 0.0–7.0)
Eosinophils Absolute: 0 10*3/uL (ref 0.0–0.5)
HCT: 31.5 % — ABNORMAL LOW (ref 34.8–46.6)
MCHC: 34.3 g/dL (ref 31.5–36.0)
MONO%: 12 % (ref 0.0–14.0)
Platelets: 109 10*3/uL — ABNORMAL LOW (ref 145–400)
WBC: 2.1 10*3/uL — ABNORMAL LOW (ref 3.9–10.3)
lymph#: 0.7 10*3/uL — ABNORMAL LOW (ref 0.9–3.3)

## 2009-05-08 LAB — COMPREHENSIVE METABOLIC PANEL
ALT: 42 U/L — ABNORMAL HIGH (ref 0–35)
Albumin: 3.3 g/dL — ABNORMAL LOW (ref 3.5–5.2)
CO2: 29 mEq/L (ref 19–32)
Glucose, Bld: 98 mg/dL (ref 70–99)
Potassium: 4.3 mEq/L (ref 3.5–5.3)
Sodium: 139 mEq/L (ref 135–145)
Total Bilirubin: 0.4 mg/dL (ref 0.3–1.2)
Total Protein: 6.3 g/dL (ref 6.0–8.3)

## 2009-05-12 LAB — CBC WITH DIFFERENTIAL/PLATELET
BASO%: 0.2 % (ref 0.0–2.0)
HCT: 29.8 % — ABNORMAL LOW (ref 34.8–46.6)
LYMPH%: 11.6 % — ABNORMAL LOW (ref 14.0–49.7)
MCHC: 34.2 g/dL (ref 31.5–36.0)
MCV: 92.3 fL (ref 79.5–101.0)
MONO#: 0 10*3/uL — ABNORMAL LOW (ref 0.1–0.9)
MONO%: 0.7 % (ref 0.0–14.0)
NEUT%: 87.3 % — ABNORMAL HIGH (ref 38.4–76.8)
Platelets: 104 10*3/uL — ABNORMAL LOW (ref 145–400)
WBC: 4.2 10*3/uL (ref 3.9–10.3)

## 2009-05-17 ENCOUNTER — Ambulatory Visit (HOSPITAL_COMMUNITY): Admission: RE | Admit: 2009-05-17 | Discharge: 2009-05-17 | Payer: Self-pay | Admitting: Oncology

## 2009-05-19 LAB — PROTIME-INR

## 2009-05-22 LAB — CBC WITH DIFFERENTIAL/PLATELET
Basophils Absolute: 0 10*3/uL (ref 0.0–0.1)
HCT: 35.3 % (ref 34.8–46.6)
HGB: 11.4 g/dL — ABNORMAL LOW (ref 11.6–15.9)
MCH: 31.2 pg (ref 25.1–34.0)
MONO#: 0.5 10*3/uL (ref 0.1–0.9)
NEUT%: 56.1 % (ref 38.4–76.8)
WBC: 2.3 10*3/uL — ABNORMAL LOW (ref 3.9–10.3)
lymph#: 0.5 10*3/uL — ABNORMAL LOW (ref 0.9–3.3)

## 2009-05-22 LAB — COMPREHENSIVE METABOLIC PANEL
Albumin: 3.4 g/dL — ABNORMAL LOW (ref 3.5–5.2)
BUN: 8 mg/dL (ref 6–23)
Calcium: 8.8 mg/dL (ref 8.4–10.5)
Chloride: 107 mEq/L (ref 96–112)
Glucose, Bld: 108 mg/dL — ABNORMAL HIGH (ref 70–99)
Potassium: 4 mEq/L (ref 3.5–5.3)

## 2009-05-22 LAB — PROTHROMBIN TIME
INR: 1.34 (ref <1.50)
Prothrombin Time: 16.5 seconds — ABNORMAL HIGH (ref 11.6–15.2)

## 2009-05-24 ENCOUNTER — Ambulatory Visit: Payer: Self-pay | Admitting: Oncology

## 2009-05-25 LAB — CBC WITH DIFFERENTIAL/PLATELET
Eosinophils Absolute: 0 10*3/uL (ref 0.0–0.5)
HCT: 33.7 % — ABNORMAL LOW (ref 34.8–46.6)
LYMPH%: 11.4 % — ABNORMAL LOW (ref 14.0–49.7)
MONO#: 0.7 10*3/uL (ref 0.1–0.9)
NEUT#: 2 10*3/uL (ref 1.5–6.5)
NEUT%: 64.6 % (ref 38.4–76.8)
Platelets: 221 10*3/uL (ref 145–400)
WBC: 3.1 10*3/uL — ABNORMAL LOW (ref 3.9–10.3)

## 2009-05-29 LAB — COMPREHENSIVE METABOLIC PANEL
ALT: 21 U/L (ref 0–35)
AST: 46 U/L — ABNORMAL HIGH (ref 0–37)
CO2: 24 mEq/L (ref 19–32)
Chloride: 104 mEq/L (ref 96–112)
Sodium: 139 mEq/L (ref 135–145)
Total Bilirubin: 0.3 mg/dL (ref 0.3–1.2)
Total Protein: 6.1 g/dL (ref 6.0–8.3)

## 2009-05-29 LAB — CBC WITH DIFFERENTIAL/PLATELET
Eosinophils Absolute: 0 10*3/uL (ref 0.0–0.5)
HCT: 41 % (ref 34.8–46.6)
LYMPH%: 14 % (ref 14.0–49.7)
MONO#: 1 10*3/uL — ABNORMAL HIGH (ref 0.1–0.9)
NEUT#: 2.7 10*3/uL (ref 1.5–6.5)
NEUT%: 61.3 % (ref 38.4–76.8)
Platelets: 214 10*3/uL (ref 145–400)
WBC: 4.4 10*3/uL (ref 3.9–10.3)
nRBC: 2 % — ABNORMAL HIGH (ref 0–0)

## 2009-05-29 LAB — MAGNESIUM: Magnesium: 2.1 mg/dL (ref 1.5–2.5)

## 2009-05-29 LAB — PROTHROMBIN TIME: INR: 3.93 — ABNORMAL HIGH (ref <1.50)

## 2009-06-06 LAB — CBC WITH DIFFERENTIAL/PLATELET
Basophils Absolute: 0 10*3/uL (ref 0.0–0.1)
EOS%: 1.9 % (ref 0.0–7.0)
HCT: 37 % (ref 34.8–46.6)
HGB: 11.9 g/dL (ref 11.6–15.9)
MCH: 30.7 pg (ref 25.1–34.0)
MCV: 95.4 fL (ref 79.5–101.0)
MONO%: 16.9 % — ABNORMAL HIGH (ref 0.0–14.0)
NEUT%: 61.4 % (ref 38.4–76.8)
lymph#: 0.7 10*3/uL — ABNORMAL LOW (ref 0.9–3.3)

## 2009-06-06 LAB — COMPREHENSIVE METABOLIC PANEL
Albumin: 2.9 g/dL — ABNORMAL LOW (ref 3.5–5.2)
Alkaline Phosphatase: 103 U/L (ref 39–117)
BUN: 9 mg/dL (ref 6–23)
Glucose, Bld: 84 mg/dL (ref 70–99)
Potassium: 4.1 mEq/L (ref 3.5–5.3)

## 2009-06-13 LAB — PROTIME-INR
INR: 4.7 — ABNORMAL HIGH (ref 2.00–3.50)
Protime: 56.4 Seconds — ABNORMAL HIGH (ref 10.6–13.4)

## 2009-06-20 LAB — CBC WITH DIFFERENTIAL/PLATELET
BASO%: 0.7 % (ref 0.0–2.0)
EOS%: 2.7 % (ref 0.0–7.0)
Eosinophils Absolute: 0 10*3/uL (ref 0.0–0.5)
MCH: 29.7 pg (ref 25.1–34.0)
MCHC: 32.4 g/dL (ref 31.5–36.0)
MCV: 91.7 fL (ref 79.5–101.0)
MONO%: 18.2 % — ABNORMAL HIGH (ref 0.0–14.0)
NEUT#: 0.7 10*3/uL — ABNORMAL LOW (ref 1.5–6.5)
RBC: 3.5 10*6/uL — ABNORMAL LOW (ref 3.70–5.45)
RDW: 14.2 % (ref 11.2–14.5)

## 2009-06-20 LAB — PROTIME-INR
INR: 2 (ref 2.00–3.50)
Protime: 24 Seconds — ABNORMAL HIGH (ref 10.6–13.4)

## 2009-06-23 ENCOUNTER — Ambulatory Visit: Payer: Self-pay | Admitting: Oncology

## 2009-06-27 LAB — CBC WITH DIFFERENTIAL/PLATELET
EOS%: 1.8 % (ref 0.0–7.0)
LYMPH%: 35.2 % (ref 14.0–49.7)
MCH: 29.3 pg (ref 25.1–34.0)
MCV: 92.9 fL (ref 79.5–101.0)
MONO%: 23.7 % — ABNORMAL HIGH (ref 0.0–14.0)
RBC: 3.68 10*6/uL — ABNORMAL LOW (ref 3.70–5.45)
RDW: 14.9 % — ABNORMAL HIGH (ref 11.2–14.5)

## 2009-06-27 LAB — PROTIME-INR: INR: 2.7 (ref 2.00–3.50)

## 2009-07-04 LAB — CBC WITH DIFFERENTIAL/PLATELET
BASO%: 0.9 % (ref 0.0–2.0)
EOS%: 0.4 % (ref 0.0–7.0)
Eosinophils Absolute: 0 10*3/uL (ref 0.0–0.5)
LYMPH%: 17.2 % (ref 14.0–49.7)
MCH: 29.4 pg (ref 25.1–34.0)
MCHC: 31.6 g/dL (ref 31.5–36.0)
MCV: 93 fL (ref 79.5–101.0)
MONO%: 11.4 % (ref 0.0–14.0)
Platelets: 121 10*3/uL — ABNORMAL LOW (ref 145–400)
RBC: 3.71 10*6/uL (ref 3.70–5.45)
RDW: 16.2 % — ABNORMAL HIGH (ref 11.2–14.5)
nRBC: 1 % — ABNORMAL HIGH (ref 0–0)

## 2009-07-04 LAB — COMPREHENSIVE METABOLIC PANEL
AST: 80 U/L — ABNORMAL HIGH (ref 0–37)
Albumin: 3.3 g/dL — ABNORMAL LOW (ref 3.5–5.2)
Alkaline Phosphatase: 202 U/L — ABNORMAL HIGH (ref 39–117)
Calcium: 8.4 mg/dL (ref 8.4–10.5)
Chloride: 104 mEq/L (ref 96–112)
Potassium: 4.3 mEq/L (ref 3.5–5.3)
Sodium: 138 mEq/L (ref 135–145)
Total Protein: 6 g/dL (ref 6.0–8.3)

## 2009-07-05 ENCOUNTER — Ambulatory Visit (HOSPITAL_COMMUNITY): Admission: RE | Admit: 2009-07-05 | Discharge: 2009-07-05 | Payer: Self-pay | Admitting: Oncology

## 2009-07-07 ENCOUNTER — Ambulatory Visit: Payer: Self-pay | Admitting: Oncology

## 2009-07-07 ENCOUNTER — Inpatient Hospital Stay (HOSPITAL_COMMUNITY): Admission: AD | Admit: 2009-07-07 | Discharge: 2009-07-11 | Payer: Self-pay | Admitting: Oncology

## 2009-07-18 LAB — PROTIME-INR

## 2009-07-18 LAB — COMPREHENSIVE METABOLIC PANEL
Albumin: 2.6 g/dL — ABNORMAL LOW (ref 3.5–5.2)
BUN: 22 mg/dL (ref 6–23)
Calcium: 7.8 mg/dL — ABNORMAL LOW (ref 8.4–10.5)
Chloride: 98 mEq/L (ref 96–112)
Creatinine, Ser: 0.72 mg/dL (ref 0.40–1.20)
Glucose, Bld: 315 mg/dL — ABNORMAL HIGH (ref 70–99)
Potassium: 4.2 mEq/L (ref 3.5–5.3)

## 2009-07-18 LAB — CBC WITH DIFFERENTIAL/PLATELET
Basophils Absolute: 0 10*3/uL (ref 0.0–0.1)
Eosinophils Absolute: 0 10*3/uL (ref 0.0–0.5)
HGB: 11.6 g/dL (ref 11.6–15.9)
MONO#: 0.4 10*3/uL (ref 0.1–0.9)
MONO%: 14.8 % — ABNORMAL HIGH (ref 0.0–14.0)
NEUT#: 1.7 10*3/uL (ref 1.5–6.5)
RBC: 3.91 10*6/uL (ref 3.70–5.45)
RDW: 17.5 % — ABNORMAL HIGH (ref 11.2–14.5)
WBC: 2.4 10*3/uL — ABNORMAL LOW (ref 3.9–10.3)
lymph#: 0.4 10*3/uL — ABNORMAL LOW (ref 0.9–3.3)

## 2009-07-18 LAB — PROTHROMBIN TIME
INR: >10 (ref <1.50)
Prothrombin Time: >90 seconds — ABNORMAL HIGH (ref 11.6–15.2)

## 2009-07-19 LAB — PROTHROMBIN TIME: Prothrombin Time: >90 seconds — ABNORMAL HIGH (ref 11.6–15.2)

## 2009-07-20 LAB — PROTIME-INR: INR: 1.6 — ABNORMAL LOW (ref 2.00–3.50)

## 2009-07-23 ENCOUNTER — Inpatient Hospital Stay (HOSPITAL_COMMUNITY): Admission: EM | Admit: 2009-07-23 | Discharge: 2009-07-28 | Payer: Self-pay | Admitting: Emergency Medicine

## 2009-07-25 ENCOUNTER — Ambulatory Visit: Payer: Self-pay | Admitting: Infectious Disease

## 2009-07-28 ENCOUNTER — Telehealth: Payer: Self-pay | Admitting: Infectious Disease

## 2009-07-28 DIAGNOSIS — R7881 Bacteremia: Secondary | ICD-10-CM | POA: Insufficient documentation

## 2009-07-28 DIAGNOSIS — B967 Clostridium perfringens [C. perfringens] as the cause of diseases classified elsewhere: Secondary | ICD-10-CM

## 2009-07-28 DIAGNOSIS — C50919 Malignant neoplasm of unspecified site of unspecified female breast: Secondary | ICD-10-CM | POA: Insufficient documentation

## 2009-07-29 ENCOUNTER — Encounter: Payer: Self-pay | Admitting: Infectious Disease

## 2009-07-31 ENCOUNTER — Ambulatory Visit: Payer: Self-pay | Admitting: Oncology

## 2009-07-31 ENCOUNTER — Encounter: Payer: Self-pay | Admitting: Infectious Disease

## 2009-08-01 LAB — CBC WITH DIFFERENTIAL/PLATELET
BASO%: 0.5 % (ref 0.0–2.0)
EOS%: 0 % (ref 0.0–7.0)
Eosinophils Absolute: 0 10*3/uL (ref 0.0–0.5)
HCT: 30 % — ABNORMAL LOW (ref 34.8–46.6)
LYMPH%: 7.6 % — ABNORMAL LOW (ref 14.0–49.7)
MCH: 29.5 pg (ref 25.1–34.0)
MCHC: 31.7 g/dL (ref 31.5–36.0)
MONO#: 0.7 10*3/uL (ref 0.1–0.9)
NEUT%: 84.2 % — ABNORMAL HIGH (ref 38.4–76.8)
Platelets: 68 10*3/uL — ABNORMAL LOW (ref 145–400)
RDW: 21.2 % — ABNORMAL HIGH (ref 11.2–14.5)
WBC: 9.6 10*3/uL (ref 3.9–10.3)
nRBC: 1 % — ABNORMAL HIGH (ref 0–0)

## 2009-08-01 LAB — COMPREHENSIVE METABOLIC PANEL
AST: 123 U/L — ABNORMAL HIGH (ref 0–37)
Alkaline Phosphatase: 380 U/L — ABNORMAL HIGH (ref 39–117)
BUN: 19 mg/dL (ref 6–23)
Creatinine, Ser: 0.62 mg/dL (ref 0.40–1.20)
Total Bilirubin: 0.6 mg/dL (ref 0.3–1.2)

## 2009-08-01 LAB — PROTIME-INR: Protime: 12 Seconds (ref 10.6–13.4)

## 2009-08-04 ENCOUNTER — Encounter: Payer: Self-pay | Admitting: Infectious Disease

## 2009-08-06 ENCOUNTER — Inpatient Hospital Stay (HOSPITAL_COMMUNITY): Admission: EM | Admit: 2009-08-06 | Discharge: 2009-08-09 | Payer: Self-pay | Admitting: Emergency Medicine

## 2009-08-07 ENCOUNTER — Encounter (INDEPENDENT_AMBULATORY_CARE_PROVIDER_SITE_OTHER): Payer: Self-pay | Admitting: Internal Medicine

## 2009-08-07 ENCOUNTER — Ambulatory Visit: Payer: Self-pay | Admitting: Oncology

## 2009-08-08 ENCOUNTER — Encounter: Payer: Self-pay | Admitting: Infectious Disease

## 2009-08-11 IMAGING — CR DG LUMBAR SPINE 2-3V
3 series · 3 of 3 positions shown · non-contrast
Comparison: 09/19/2008 bone scan

CLINICAL DATA: Abnormal activity in the L4-L5 region on bone scan.

LUMBAR SPINE - 2-3 VIEW

[t l-spine a.p.]
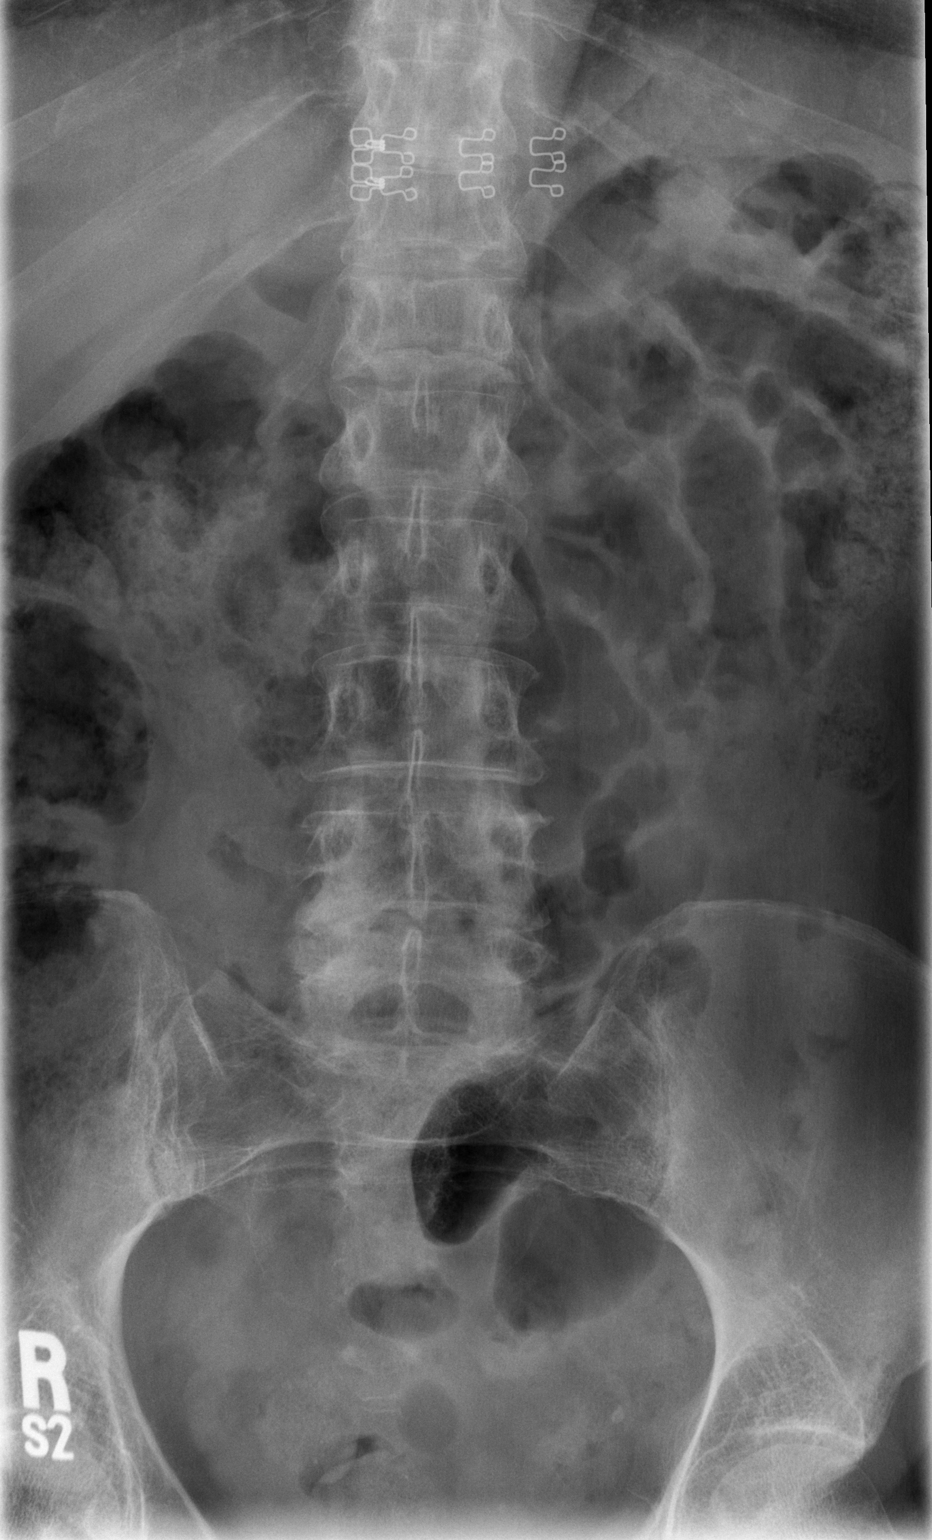

[t l-spine lat]
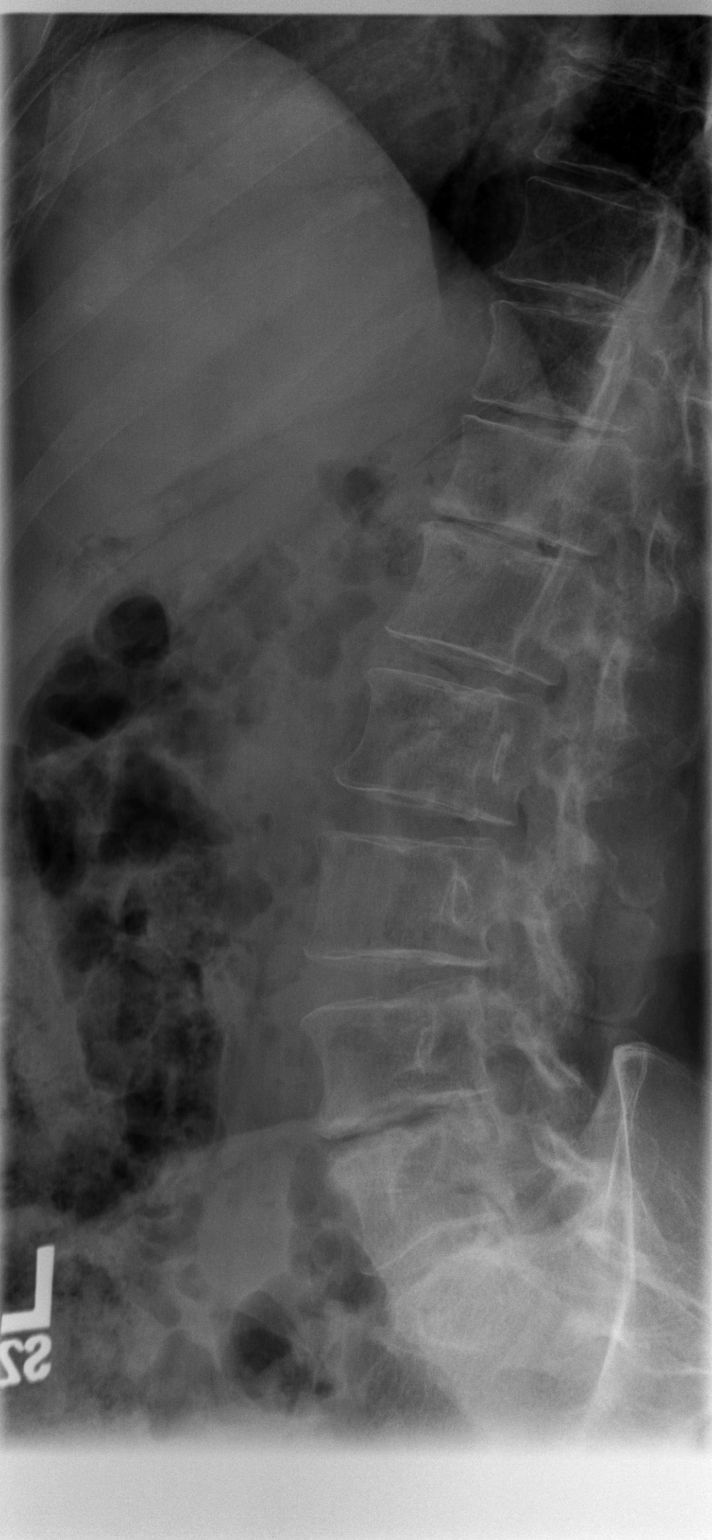

[t l-spine l5-s1 spot]
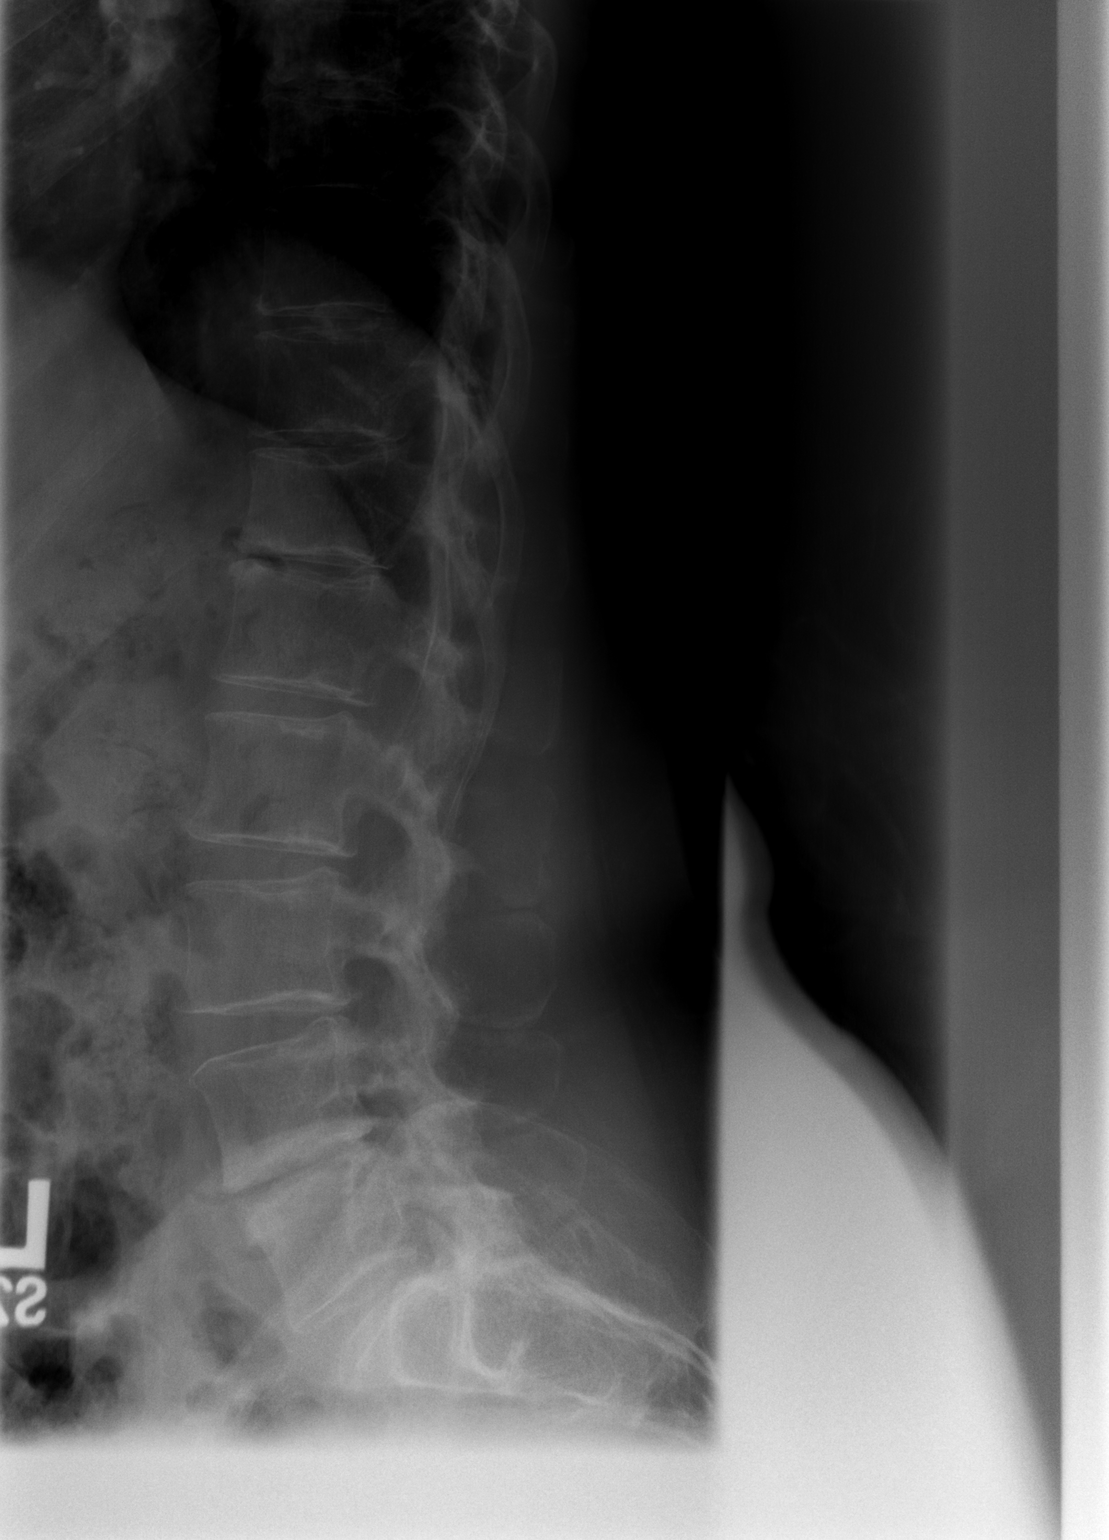

[3 of 3 positions shown; findings below may reference images not displayed]

FINDINGS: Moderate degenerative disc disease and spondylosis at L4-
L5 is noted corresponds to increased bony activity identified on
bone scan.
Normal alignment is noted.
Mild multilevel degenerative changes are also noted.
No suspicious focal bony lesions are present.
IMPRESSION: Moderate degenerative disc disease and spondylosis at L4-L5
corresponding to increased bony activity on recent bone scan.

## 2009-08-21 ENCOUNTER — Encounter: Payer: Self-pay | Admitting: Infectious Disease

## 2009-08-31 ENCOUNTER — Ambulatory Visit: Payer: Self-pay | Admitting: Oncology

## 2009-09-01 LAB — URINALYSIS, MICROSCOPIC - CHCC
Bilirubin (Urine): NEGATIVE
Glucose: NEGATIVE g/dL
Nitrite: NEGATIVE
pH: 6 (ref 4.6–8.0)

## 2009-10-06 DEATH — deceased

## 2009-11-16 ENCOUNTER — Encounter: Payer: Self-pay | Admitting: Internal Medicine

## 2010-03-09 IMAGING — CT CT ABD-PELV W/ CM
2 of 5 series · 15 of 46 positions shown, 17 images · IV contrast (agent unspecified)
Comparison: CT chest 02/14/2009 and PET CT 07/26/2008

CT CHEST

CLINICAL DATA: Restaging breast cancer.  Ongoing chemotherapy.

CT CHEST, ABDOMEN AND PELVIS WITH CONTRAST
TECHNIQUE: Multidetector CT imaging of the chest, abdomen and
pelvis was performed following the standard protocol during bolus
administration of intravenous contrast.
Contrast: 100 ml Qmnipaque-TAA

[Series 2: cap with st · axial · 0.73mm/px · z∈[-666,-140]mm · 12 of 120 slices shown, 14 images]
[im 8/120  soft-tissue]
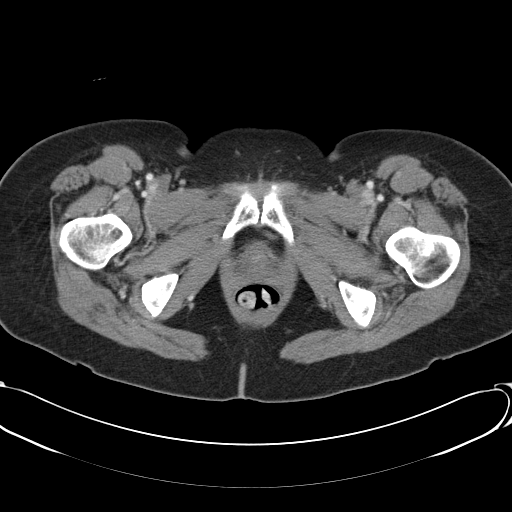
[im 8/120  bone]
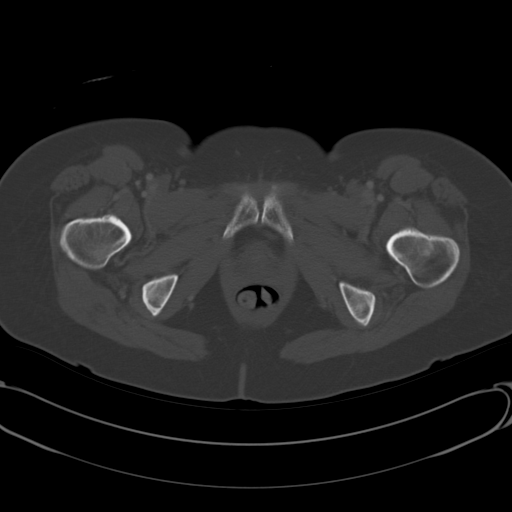
[im 22/120  soft-tissue]
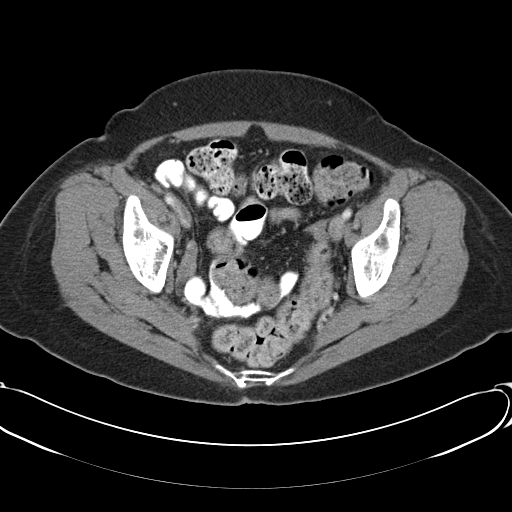
[im 29/120  soft-tissue]
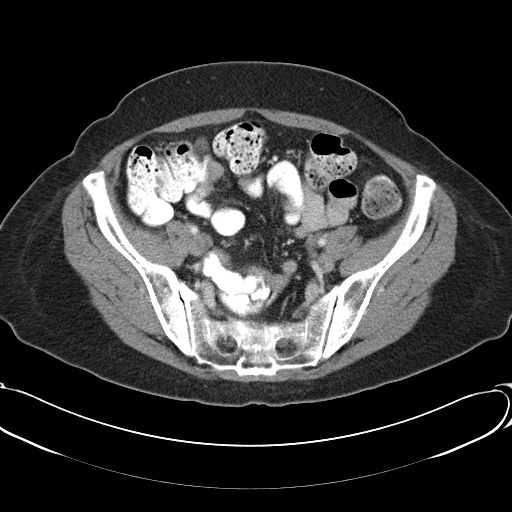
[im 36/120  soft-tissue]
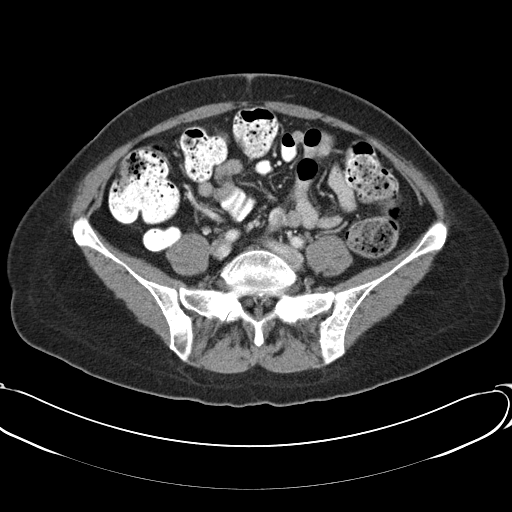
[im 50/120  soft-tissue]
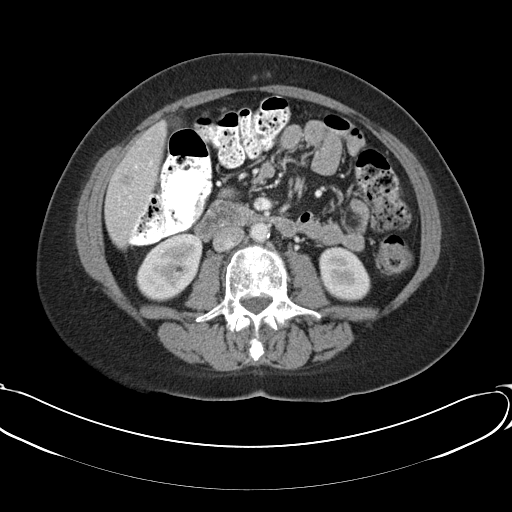
[im 57/120  soft-tissue]
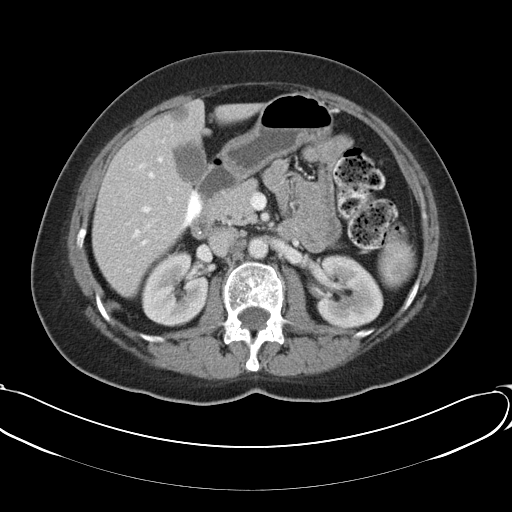
[im 64/120  soft-tissue]
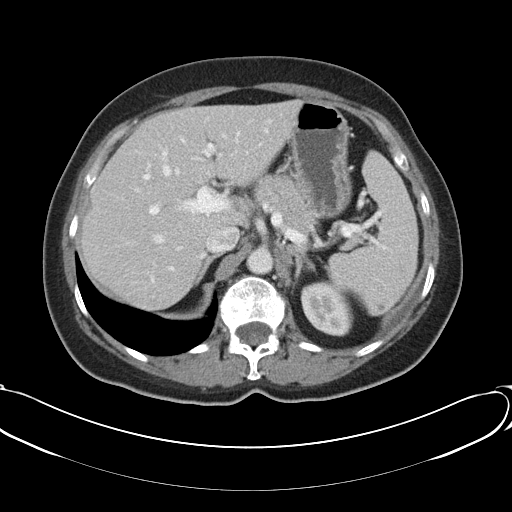
[im 78/120  soft-tissue]
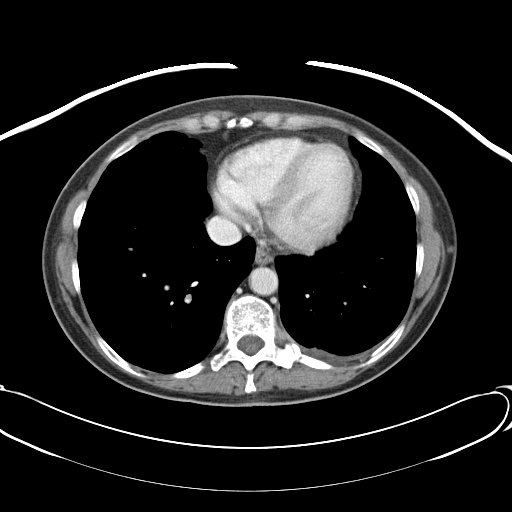
[im 85/120  soft-tissue]
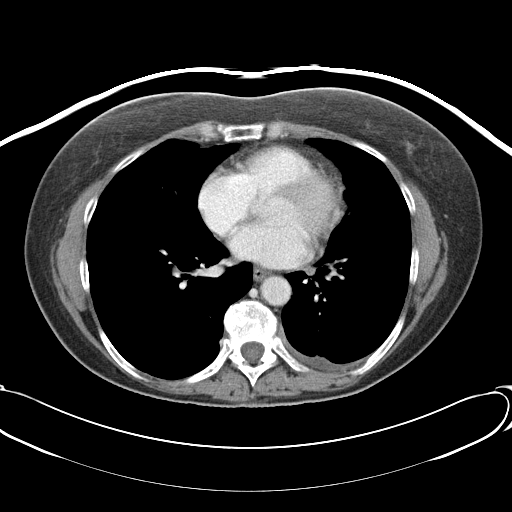
[im 85/120  bone]
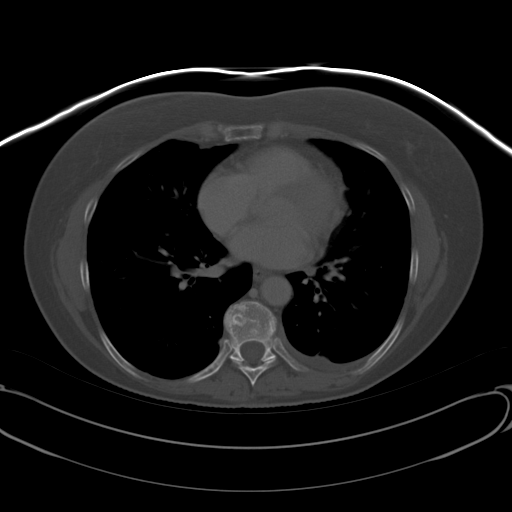
[im 92/120  soft-tissue]
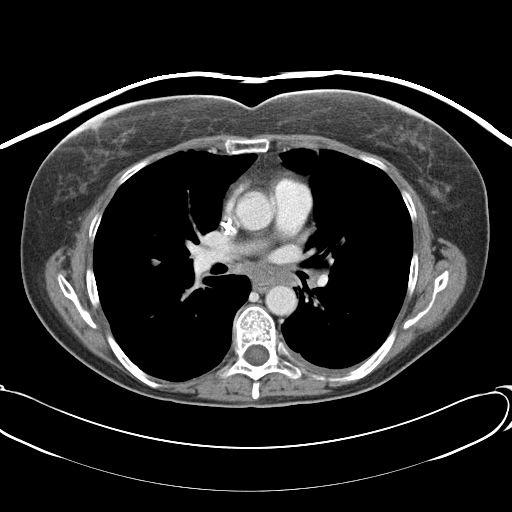
[im 106/120  soft-tissue]
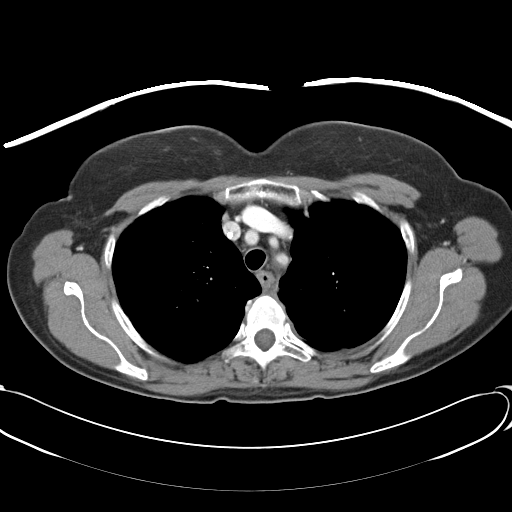
[im 113/120  soft-tissue]
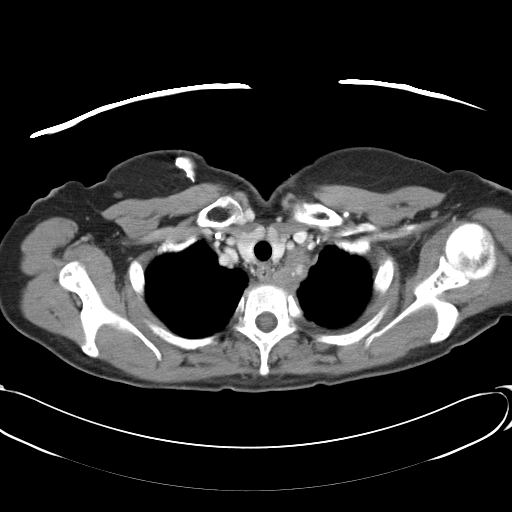

[Series 602: <mpr thick range> · coronal · 1.17mm/px · 3 of 80 slices shown]
[im 27/80  soft-tissue]
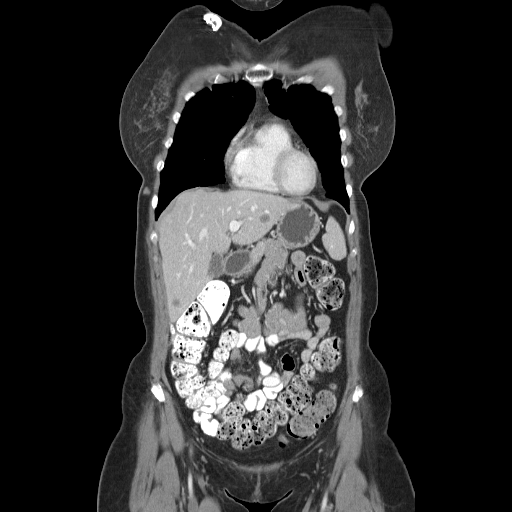
[im 36/80  soft-tissue]
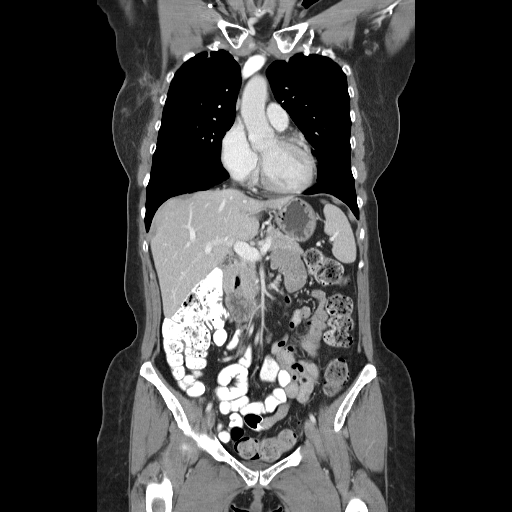
[im 44/80  soft-tissue]
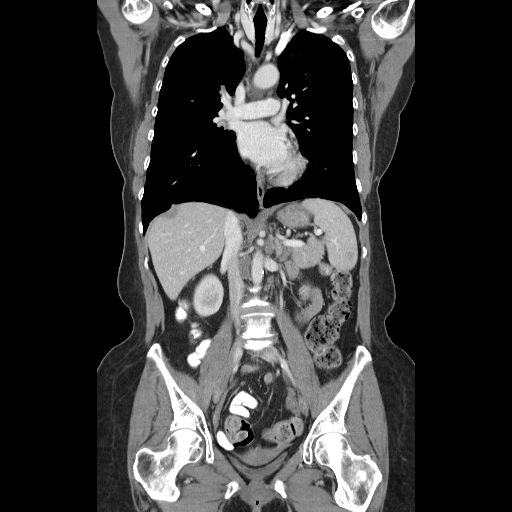

[15 of 46 positions shown; findings below may reference images not displayed]

FINDINGS: Infiltrative and nodular soft tissue in the low left
internal jugular region extends posteriorly, contacting the
thoracic spine.  There is vascular encasement.  The nodular
component has decreased in size, measuring 1.4 cm in short axis
(previously 1.8 cm).  A dramatic decrease in size of AP window,
precarinal, subcarinal and bihilar adenopathy is seen.  Soft tissue
in the subcarinal region measures approximately 12 mm in short axis
(previously 2.8 cm).  There is mild bihilar lymphoid tissue.  Heart
size normal.  No pericardial or pleural effusion.  Small pre
pericardiac lymph nodes.  No internal mammary adenopathy.

A small nodule in the outer aspect of the right breast measures 6
mm, stable.  A second nodule along the right lower anterolateral
chest wall measures 9 x 12 mm (previously 11 x 15 mm).  Surgical
clips and stranding are seen in the right axilla.  No
pathologically enlarged lymph nodes in the axillary regions.

Small left pleural effusion.  A perilymphatic distribution of tiny
nodules is seen bilaterally.  Larger nodules seen on 02/14/2009
have decreased in size in the interval.  For example, an ill-
defined nodule in the anterior aspect of the apical right upper
lobe has nearly completely resolved (image 14). Airway is
unremarkable.
IMPRESSION: 1.  Interval response to therapy as evidenced by a decrease in size
of mediastinal and bihilar adenopathy, as well as a decrease in
size of bilateral pulmonary nodules.
2.  Slight decrease in size of a nodule in the right lower
anterolateral chest wall.
3.  Small left pleural effusion.

CT ABDOMEN AND PELVIS
FINDINGS: There are low attenuation lesions in the liver, some of
which appear new.  An 11 mm lesion in the left hepatic lobe (image
52) is new.  A 1.6 x 1.2 cm nodule seen in the right hepatic lobe
on the prior study is not visualized on the current study.
Gallbladder and adrenal glands unremarkable.  A 1.5 cm exophytic
lesion off the lower pole of the right kidney cannot be
characterized as a simple cyst, and may be larger than on PET CT
performed 07/26/2008.  Left kidney is unremarkable.

A 3.5 x 2.7 cm low attenuation lesion in the posterior aspect of
the spleen has increased in size from 02/14/2009, at which time it
measured 1.5 x 1.4 cm. There is an adjacent sub centimeter low
attenuation lesion in the spleen as well.  Pancreas, stomach and
small bowel are unremarkable.

Adenopathy in the left periaortic station is seen adjacent to the
left adrenal gland, measuring 1.1 x 2.1 cm, and is stable from
02/14/2009.  Right diaphragmatic crus appears thickened, measuring
1.2 cm.  There are new nodules in the subcutaneous fat of the
anterior abdomen, measuring up to 5 mm.

Uterus and ovaries are visualized.  Colon is unremarkable.  Bladder
is decompressed.  No pathologically enlarged lymph nodes in the
pelvis.  No free fluid.  There are new sclerotic lesions in the
pelvis when compared with 07/26/2008.  Additional mixed lytic and
sclerotic lesions are seen in the spine, as before.  There is
compression of the L3 vertebral body, new from 09/19/2008.
IMPRESSION: 1.  Interval progression of hepatic, splenic and osseous metastatic
disease.
2.  Pathologic L3 compression fracture is new from 09/19/2008.
3.  Exophytic lesion off the right kidney appears larger than on
07/26/2008, at which time it was shown to be hypermetabolic,
favoring malignancy.
4.  New subcutaneous nodules.
5.  Retroperitoneal adenopathy and thickening of the diaphragmatic
crus appear stable.

## 2010-04-29 ENCOUNTER — Encounter: Payer: Self-pay | Admitting: Oncology

## 2010-05-08 NOTE — Miscellaneous (Signed)
Summary: [facility]:Orders  [facility]:Orders   Imported By: Florinda Marker 09/06/2009 14:31:04  _____________________________________________________________________  External Attachment:    Type:   Image     Comment:   External Document

## 2010-05-08 NOTE — Progress Notes (Signed)
Summary: Care Plan Oversight  Phone Note Outgoing Call   Call placed by: Acey Lav MD,  July 28, 2009 9:29 PM Summary of Call: [phone] (30 or more mins)  I have supervised home care and/or infusion therapy for this pt, including providing orders for care, review of labs and/or home health care plans, communicating with the home health care professionals and/or patient/caregivers to integrate current information into the medical treatment plan and/or adjust the medical therapy. This supervision has been provided for _32_minutes during the calendar month. Dates for this oversight _4/22/11 thru 5/21/11_.   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

## 2010-05-08 NOTE — Miscellaneous (Signed)
Summary: [facility]: Orders  [facility]: Orders   Imported By: Florinda Marker 08/09/2009 16:32:13  _____________________________________________________________________  External Attachment:    Type:   Image     Comment:   External Document

## 2010-05-08 NOTE — Miscellaneous (Signed)
Summary: [facility]; Orders  [facility]; Orders   Imported By: Florinda Marker 12/01/2009 16:49:59  _____________________________________________________________________  External Attachment:    Type:   Image     Comment:   External Document

## 2010-05-08 NOTE — Miscellaneous (Signed)
Summary: [facility]: RX Orders  [facility]: RX Orders   Imported By: Florinda Marker 08/02/2009 10:59:07  _____________________________________________________________________  External Attachment:    Type:   Image     Comment:   External Document

## 2010-05-08 NOTE — Miscellaneous (Signed)
Summary: [facility]:Communication  [facility]:Communication   Imported By: Florinda Marker 08/22/2009 12:16:37  _____________________________________________________________________  External Attachment:    Type:   Image     Comment:   External Document

## 2010-05-08 NOTE — Miscellaneous (Signed)
Summary: [facility] Health Cert[facility] Plan    [facility]: [facility] Cert[facility] Plan    Imported By: Florinda Marker 08/22/2009 14:17:20  _____________________________________________________________________  External Attachment:    Type:   Image     Comment:   External Document

## 2010-06-24 LAB — CBC
HCT: 34.2 % — ABNORMAL LOW (ref 36.0–46.0)
Hemoglobin: 11.4 g/dL — ABNORMAL LOW (ref 12.0–15.0)
MCV: 96.6 fL (ref 78.0–100.0)
Platelets: 191 10*3/uL (ref 150–400)
RBC: 3.54 MIL/uL — ABNORMAL LOW (ref 3.87–5.11)
WBC: 11.2 10*3/uL — ABNORMAL HIGH (ref 4.0–10.5)

## 2010-06-24 LAB — PROTIME-INR
INR: 1.03 (ref 0.00–1.49)
Prothrombin Time: 13.4 seconds (ref 11.6–15.2)

## 2010-06-26 LAB — DIFFERENTIAL
Band Neutrophils: 0 % (ref 0–10)
Band Neutrophils: 0 % (ref 0–10)
Basophils Relative: 0 % (ref 0–1)
Basophils Relative: 0 % (ref 0–1)
Basophils Relative: 0 % (ref 0–1)
Blasts: 0 %
Eosinophils Absolute: 0 10*3/uL (ref 0.0–0.7)
Eosinophils Absolute: 0 10*3/uL (ref 0.0–0.7)
Eosinophils Relative: 0 % (ref 0–5)
Eosinophils Relative: 0 % (ref 0–5)
Lymphocytes Relative: 4 % — ABNORMAL LOW (ref 12–46)
Lymphocytes Relative: 0 % — ABNORMAL LOW (ref 12–46)
Lymphocytes Relative: 14 % (ref 12–46)
Lymphs Abs: 0.5 10*3/uL — ABNORMAL LOW (ref 0.7–4.0)
Lymphs Abs: 0 10*3/uL — ABNORMAL LOW (ref 0.7–4.0)
Lymphs Abs: 0.7 10*3/uL (ref 0.7–4.0)
Monocytes Absolute: 0.2 10*3/uL (ref 0.1–1.0)
Monocytes Absolute: 0.5 10*3/uL (ref 0.1–1.0)
Monocytes Absolute: 0 10*3/uL — ABNORMAL LOW (ref 0.1–1.0)
Monocytes Absolute: 0 10*3/uL — ABNORMAL LOW (ref 0.1–1.0)
Monocytes Relative: 2 % — ABNORMAL LOW (ref 3–12)
Monocytes Relative: 0 % — ABNORMAL LOW (ref 3–12)
Neutrophils Relative %: 87 % — ABNORMAL HIGH (ref 43–77)
Neutrophils Relative %: 94 % — ABNORMAL HIGH (ref 43–77)
Neutrophils Relative %: 0 % — ABNORMAL LOW (ref 43–77)
Neutrophils Relative %: 79 % — ABNORMAL HIGH (ref 43–77)
Promyelocytes Absolute: 0 %
nRBC: 0 /100 WBC

## 2010-06-26 LAB — URINALYSIS, ROUTINE W REFLEX MICROSCOPIC
Bilirubin Urine: NEGATIVE
Glucose, UA: >1000 mg/dL — AB
Ketones, ur: NEGATIVE mg/dL
Ketones, ur: NEGATIVE mg/dL
Leukocytes, UA: NEGATIVE
Leukocytes, UA: NEGATIVE
Nitrite: NEGATIVE
Nitrite: NEGATIVE
Protein, ur: NEGATIVE mg/dL
Protein, ur: 30 mg/dL — AB
Specific Gravity, Urine: 1.026 (ref 1.005–1.030)
Urobilinogen, UA: 0.2 mg/dL (ref 0.0–1.0)
pH: 6.5 (ref 5.0–8.0)

## 2010-06-26 LAB — COMPREHENSIVE METABOLIC PANEL
ALT: 144 U/L — ABNORMAL HIGH (ref 0–35)
ALT: 275 U/L — ABNORMAL HIGH (ref 0–35)
AST: 109 U/L — ABNORMAL HIGH (ref 0–37)
AST: 114 U/L — ABNORMAL HIGH (ref 0–37)
Albumin: 2.2 g/dL — ABNORMAL LOW (ref 3.5–5.2)
Albumin: 1.9 g/dL — ABNORMAL LOW (ref 3.5–5.2)
Alkaline Phosphatase: 288 U/L — ABNORMAL HIGH (ref 39–117)
Alkaline Phosphatase: 247 U/L — ABNORMAL HIGH (ref 39–117)
Alkaline Phosphatase: 353 U/L — ABNORMAL HIGH (ref 39–117)
Alkaline Phosphatase: 365 U/L — ABNORMAL HIGH (ref 39–117)
BUN: 16 mg/dL (ref 6–23)
BUN: 13 mg/dL (ref 6–23)
BUN: 17 mg/dL (ref 6–23)
CO2: 30 mEq/L (ref 19–32)
Calcium: 7 mg/dL — ABNORMAL LOW (ref 8.4–10.5)
Chloride: 104 mEq/L (ref 96–112)
Chloride: 103 mEq/L (ref 96–112)
Chloride: 105 mEq/L (ref 96–112)
Creatinine, Ser: 0.59 mg/dL (ref 0.4–1.2)
GFR calc Af Amer: >60 mL/min (ref >60)
GFR calc Af Amer: >60 mL/min (ref >60)
GFR calc non Af Amer: >60 mL/min (ref >60)
Glucose, Bld: 183 mg/dL — ABNORMAL HIGH (ref 70–99)
Glucose, Bld: 181 mg/dL — ABNORMAL HIGH (ref 70–99)
Glucose, Bld: 214 mg/dL — ABNORMAL HIGH (ref 70–99)
Potassium: 3.7 mEq/L (ref 3.5–5.1)
Potassium: 3.8 mEq/L (ref 3.5–5.1)
Potassium: 3.9 mEq/L (ref 3.5–5.1)
Sodium: 134 mEq/L — ABNORMAL LOW (ref 135–145)
Total Bilirubin: 0.8 mg/dL (ref 0.3–1.2)
Total Bilirubin: 1 mg/dL (ref 0.3–1.2)
Total Bilirubin: 1 mg/dL (ref 0.3–1.2)
Total Protein: 4.2 g/dL — ABNORMAL LOW (ref 6.0–8.3)

## 2010-06-26 LAB — GLUCOSE, CAPILLARY
Glucose-Capillary: 129 mg/dL — ABNORMAL HIGH (ref 70–99)
Glucose-Capillary: 146 mg/dL — ABNORMAL HIGH (ref 70–99)
Glucose-Capillary: 150 mg/dL — ABNORMAL HIGH (ref 70–99)
Glucose-Capillary: 178 mg/dL — ABNORMAL HIGH (ref 70–99)
Glucose-Capillary: 217 mg/dL — ABNORMAL HIGH (ref 70–99)
Glucose-Capillary: 258 mg/dL — ABNORMAL HIGH (ref 70–99)
Glucose-Capillary: 287 mg/dL — ABNORMAL HIGH (ref 70–99)
Glucose-Capillary: 293 mg/dL — ABNORMAL HIGH (ref 70–99)
Glucose-Capillary: 428 mg/dL — ABNORMAL HIGH (ref 70–99)
Glucose-Capillary: 495 mg/dL — ABNORMAL HIGH (ref 70–99)
Glucose-Capillary: 65 mg/dL — ABNORMAL LOW (ref 70–99)
Glucose-Capillary: >600 mg/dL (ref 70–99)
Glucose-Capillary: 181 mg/dL — ABNORMAL HIGH (ref 70–99)
Glucose-Capillary: 186 mg/dL — ABNORMAL HIGH (ref 70–99)
Glucose-Capillary: 205 mg/dL — ABNORMAL HIGH (ref 70–99)
Glucose-Capillary: 209 mg/dL — ABNORMAL HIGH (ref 70–99)
Glucose-Capillary: 215 mg/dL — ABNORMAL HIGH (ref 70–99)
Glucose-Capillary: 253 mg/dL — ABNORMAL HIGH (ref 70–99)
Glucose-Capillary: 303 mg/dL — ABNORMAL HIGH (ref 70–99)
Glucose-Capillary: 321 mg/dL — ABNORMAL HIGH (ref 70–99)

## 2010-06-26 LAB — CBC
HCT: 24.9 % — ABNORMAL LOW (ref 36.0–46.0)
HCT: 25.8 % — ABNORMAL LOW (ref 36.0–46.0)
HCT: 26.5 % — ABNORMAL LOW (ref 36.0–46.0)
HCT: 27.1 % — ABNORMAL LOW (ref 36.0–46.0)
Hemoglobin: 8.2 g/dL — ABNORMAL LOW (ref 12.0–15.0)
Hemoglobin: 8.6 g/dL — ABNORMAL LOW (ref 12.0–15.0)
Hemoglobin: 8.7 g/dL — ABNORMAL LOW (ref 12.0–15.0)
Hemoglobin: 8.9 g/dL — ABNORMAL LOW (ref 12.0–15.0)
Hemoglobin: 9.2 g/dL — ABNORMAL LOW (ref 12.0–15.0)
MCHC: 33 g/dL (ref 30.0–36.0)
MCHC: 32.8 g/dL (ref 30.0–36.0)
MCHC: 33.2 g/dL (ref 30.0–36.0)
MCHC: 33.3 g/dL (ref 30.0–36.0)
MCV: 96 fL (ref 78.0–100.0)
MCV: 91.1 fL (ref 78.0–100.0)
MCV: 91.5 fL (ref 78.0–100.0)
Platelets: 70 10*3/uL — ABNORMAL LOW (ref 150–400)
Platelets: 80 10*3/uL — ABNORMAL LOW (ref 150–400)
Platelets: 48 10*3/uL — ABNORMAL LOW (ref 150–400)
Platelets: 50 10*3/uL — ABNORMAL LOW (ref 150–400)
Platelets: 53 10*3/uL — ABNORMAL LOW (ref 150–400)
Platelets: 72 10*3/uL — ABNORMAL LOW (ref 150–400)
RBC: 2.6 MIL/uL — ABNORMAL LOW (ref 3.87–5.11)
RBC: 2.83 MIL/uL — ABNORMAL LOW (ref 3.87–5.11)
RDW: 25.1 % — ABNORMAL HIGH (ref 11.5–15.5)
RDW: 19.3 % — ABNORMAL HIGH (ref 11.5–15.5)
RDW: 19.4 % — ABNORMAL HIGH (ref 11.5–15.5)
RDW: 19.8 % — ABNORMAL HIGH (ref 11.5–15.5)
RDW: 19.9 % — ABNORMAL HIGH (ref 11.5–15.5)
WBC: 7.7 10*3/uL (ref 4.0–10.5)
WBC: 9.6 10*3/uL (ref 4.0–10.5)
WBC: 11 10*3/uL — ABNORMAL HIGH (ref 4.0–10.5)
WBC: 4.8 10*3/uL (ref 4.0–10.5)
WBC: 9.2 10*3/uL (ref 4.0–10.5)

## 2010-06-26 LAB — HEMOGLOBIN A1C
Hgb A1c MFr Bld: 7.9 % — ABNORMAL HIGH
Hgb A1c MFr Bld: 7.8 % — ABNORMAL HIGH (ref <5.7)
Mean Plasma Glucose: 180 mg/dL — ABNORMAL HIGH
Mean Plasma Glucose: 177 mg/dL — ABNORMAL HIGH (ref <117)

## 2010-06-26 LAB — BASIC METABOLIC PANEL
BUN: 14 mg/dL (ref 6–23)
BUN: 18 mg/dL (ref 6–23)
BUN: 15 mg/dL (ref 6–23)
CO2: 30 mEq/L (ref 19–32)
CO2: 30 mEq/L (ref 19–32)
CO2: 28 mEq/L (ref 19–32)
Chloride: 100 mEq/L (ref 96–112)
Chloride: 104 mEq/L (ref 96–112)
Chloride: 106 mEq/L (ref 96–112)
Chloride: 92 mEq/L — ABNORMAL LOW (ref 96–112)
Creatinine, Ser: 0.49 mg/dL (ref 0.4–1.2)
Creatinine, Ser: 0.63 mg/dL (ref 0.4–1.2)
GFR calc Af Amer: >60 mL/min (ref >60)
GFR calc non Af Amer: >60 mL/min (ref >60)
Glucose, Bld: 325 mg/dL — ABNORMAL HIGH (ref 70–99)
Glucose, Bld: 71 mg/dL (ref 70–99)
Glucose, Bld: 71 mg/dL (ref 70–99)
Glucose, Bld: 292 mg/dL — ABNORMAL HIGH (ref 70–99)
Potassium: 4 mEq/L (ref 3.5–5.1)
Potassium: 4 mEq/L (ref 3.5–5.1)
Potassium: 4.1 mEq/L (ref 3.5–5.1)
Potassium: 4.7 mEq/L (ref 3.5–5.1)
Sodium: 129 mEq/L — ABNORMAL LOW (ref 135–145)
Sodium: 134 mEq/L — ABNORMAL LOW (ref 135–145)
Sodium: 136 mEq/L (ref 135–145)

## 2010-06-26 LAB — POCT I-STAT, CHEM 8
BUN: 31 mg/dL — ABNORMAL HIGH (ref 6–23)
BUN: 19 mg/dL (ref 6–23)
Calcium, Ion: 1.03 mmol/L — ABNORMAL LOW (ref 1.12–1.32)
Calcium, Ion: 0.92 mmol/L — ABNORMAL LOW (ref 1.12–1.32)
Chloride: 93 meq/L — ABNORMAL LOW (ref 96–112)
Chloride: 95 mEq/L — ABNORMAL LOW (ref 96–112)
Creatinine, Ser: 0.5 mg/dL (ref 0.4–1.2)
Creatinine, Ser: 0.6 mg/dL (ref 0.4–1.2)
Glucose, Bld: 605 mg/dL (ref 70–99)
HCT: 33 % — ABNORMAL LOW (ref 36.0–46.0)
Hemoglobin: 11.2 g/dL — ABNORMAL LOW (ref 12.0–15.0)
Potassium: 4.5 meq/L (ref 3.5–5.1)
Sodium: 129 meq/L — ABNORMAL LOW (ref 135–145)
TCO2: 31 mmol/L (ref 0–100)

## 2010-06-26 LAB — CULTURE, BLOOD (ROUTINE X 2)
Culture: NO GROWTH
Culture: NO GROWTH

## 2010-06-26 LAB — CULTURE, RESPIRATORY W GRAM STAIN

## 2010-06-26 LAB — BLOOD GAS, ARTERIAL
Drawn by: 145321
O2 Content: 2 L/min
Patient temperature: 98.6
pCO2 arterial: 40.5 mmHg (ref 35.0–45.0)
pH, Arterial: 7.459 — ABNORMAL HIGH (ref 7.350–7.400)

## 2010-06-26 LAB — POCT CARDIAC MARKERS
Myoglobin, poc: 395 ng/mL (ref 12–200)
Myoglobin, poc: 417 ng/mL (ref 12–200)
Troponin i, poc: <0.05 ng/mL (ref 0.00–0.09)
Troponin i, poc: <0.05 ng/mL (ref 0.00–0.09)

## 2010-06-26 LAB — URINE CULTURE
Colony Count: NO GROWTH
Culture: NO GROWTH
Culture: NO GROWTH

## 2010-06-26 LAB — ACETAMINOPHEN LEVEL: Acetaminophen (Tylenol), Serum: <10 ug/mL — ABNORMAL LOW (ref 10–30)

## 2010-06-26 LAB — RAPID URINE DRUG SCREEN, HOSP PERFORMED
Barbiturates: NOT DETECTED
Benzodiazepines: POSITIVE — AB

## 2010-06-26 LAB — URINE MICROSCOPIC-ADD ON: Urine-Other: NONE SEEN

## 2010-06-26 LAB — AMMONIA: Ammonia: 44 umol/L — ABNORMAL HIGH (ref 11–35)

## 2010-06-26 LAB — APTT: aPTT: 28 seconds (ref 24–37)

## 2010-06-26 LAB — ETHANOL: Alcohol, Ethyl (B): <5 mg/dL (ref 0–10)

## 2010-06-26 LAB — VANCOMYCIN, TROUGH: Vancomycin Tr: 15.4 ug/mL (ref 10.0–20.0)

## 2010-06-27 LAB — COMPREHENSIVE METABOLIC PANEL
ALT: 246 U/L — ABNORMAL HIGH (ref 0–35)
AST: 136 U/L — ABNORMAL HIGH (ref 0–37)
AST: 319 U/L — ABNORMAL HIGH (ref 0–37)
Albumin: 2.7 g/dL — ABNORMAL LOW (ref 3.5–5.2)
Albumin: 2.7 g/dL — ABNORMAL LOW (ref 3.5–5.2)
Alkaline Phosphatase: 186 U/L — ABNORMAL HIGH (ref 39–117)
BUN: 14 mg/dL (ref 6–23)
Calcium: 8.3 mg/dL — ABNORMAL LOW (ref 8.4–10.5)
Chloride: 109 mEq/L (ref 96–112)
Creatinine, Ser: 0.69 mg/dL (ref 0.4–1.2)
GFR calc Af Amer: >60 mL/min (ref >60)
GFR calc Af Amer: >60 mL/min (ref >60)
Potassium: 3.9 mEq/L (ref 3.5–5.1)
Sodium: 139 mEq/L (ref 135–145)
Total Bilirubin: 0.6 mg/dL (ref 0.3–1.2)
Total Protein: 5.5 g/dL — ABNORMAL LOW (ref 6.0–8.3)
Total Protein: 5.6 g/dL — ABNORMAL LOW (ref 6.0–8.3)

## 2010-06-27 LAB — CBC
HCT: 29.2 % — ABNORMAL LOW (ref 36.0–46.0)
HCT: 30 % — ABNORMAL LOW (ref 36.0–46.0)
MCHC: 32.7 g/dL (ref 30.0–36.0)
MCV: 92.5 fL (ref 78.0–100.0)
MCV: 92.8 fL (ref 78.0–100.0)
Platelets: 75 10*3/uL — ABNORMAL LOW (ref 150–400)
Platelets: 94 10*3/uL — ABNORMAL LOW (ref 150–400)
Platelets: 99 10*3/uL — ABNORMAL LOW (ref 150–400)
RDW: 17.5 % — ABNORMAL HIGH (ref 11.5–15.5)
WBC: 17.7 10*3/uL — ABNORMAL HIGH (ref 4.0–10.5)
WBC: 3.1 10*3/uL — ABNORMAL LOW (ref 4.0–10.5)
WBC: 7.3 10*3/uL (ref 4.0–10.5)

## 2010-06-27 LAB — CLOSTRIDIUM DIFFICILE EIA: C difficile Toxins A+B, EIA: NEGATIVE

## 2010-06-27 LAB — CULTURE, RESPIRATORY W GRAM STAIN

## 2010-06-27 LAB — PROTIME-INR
INR: 1.96 — ABNORMAL HIGH (ref 0.00–1.49)
INR: 3.14 — ABNORMAL HIGH (ref 0.00–1.49)
Prothrombin Time: 22.2 seconds — ABNORMAL HIGH (ref 11.6–15.2)
Prothrombin Time: 28.9 seconds — ABNORMAL HIGH (ref 11.6–15.2)
Prothrombin Time: 32 seconds — ABNORMAL HIGH (ref 11.6–15.2)

## 2010-06-27 LAB — DIFFERENTIAL
Eosinophils Absolute: 0 10*3/uL (ref 0.0–0.7)
Eosinophils Relative: 0 % (ref 0–5)
Eosinophils Relative: 0 % (ref 0–5)
Lymphocytes Relative: 10 % — ABNORMAL LOW (ref 12–46)
Lymphs Abs: 0.3 10*3/uL — ABNORMAL LOW (ref 0.7–4.0)
Lymphs Abs: 0.4 10*3/uL — ABNORMAL LOW (ref 0.7–4.0)
Monocytes Absolute: 0.3 10*3/uL (ref 0.1–1.0)

## 2010-06-27 LAB — EXPECTORATED SPUTUM ASSESSMENT W GRAM STAIN, RFLX TO RESP C

## 2010-07-11 LAB — COMPREHENSIVE METABOLIC PANEL
Albumin: 3.7 g/dL (ref 3.5–5.2)
Alkaline Phosphatase: 91 U/L (ref 39–117)
BUN: 7 mg/dL (ref 6–23)
CO2: 28 mEq/L (ref 19–32)
Chloride: 98 mEq/L (ref 96–112)
Creatinine, Ser: 0.75 mg/dL (ref 0.4–1.2)
GFR calc non Af Amer: >60 mL/min (ref >60)
Potassium: 3.4 mEq/L — ABNORMAL LOW (ref 3.5–5.1)
Total Bilirubin: 0.7 mg/dL (ref 0.3–1.2)

## 2010-07-11 LAB — BLOOD GAS, ARTERIAL
Acid-Base Excess: 3.2 mmol/L — ABNORMAL HIGH (ref 0.0–2.0)
Bicarbonate: 28 mEq/L — ABNORMAL HIGH (ref 20.0–24.0)
TCO2: 25 mmol/L (ref 0–100)
pCO2 arterial: 45.6 mmHg — ABNORMAL HIGH (ref 35.0–45.0)
pH, Arterial: 7.406 — ABNORMAL HIGH (ref 7.350–7.400)
pO2, Arterial: 120 mmHg — ABNORMAL HIGH (ref 80.0–100.0)

## 2010-07-11 LAB — CBC
HCT: 37.2 % (ref 36.0–46.0)
HCT: 41.3 % (ref 36.0–46.0)
Hemoglobin: 13.9 g/dL (ref 12.0–15.0)
MCHC: 33.7 g/dL (ref 30.0–36.0)
MCV: 95 fL (ref 78.0–100.0)
Platelets: 120 10*3/uL — ABNORMAL LOW (ref 150–400)
Platelets: 246 10*3/uL (ref 150–400)
RBC: 4.35 MIL/uL (ref 3.87–5.11)
RDW: 11.8 % (ref 11.5–15.5)
WBC: 5.8 10*3/uL (ref 4.0–10.5)

## 2010-07-11 LAB — BASIC METABOLIC PANEL
BUN: 10 mg/dL (ref 6–23)
CO2: 28 mEq/L (ref 19–32)
GFR calc non Af Amer: >60 mL/min (ref >60)
Glucose, Bld: 86 mg/dL (ref 70–99)
Potassium: 3.6 mEq/L (ref 3.5–5.1)

## 2010-07-11 LAB — DIFFERENTIAL
Basophils Absolute: 0 10*3/uL (ref 0.0–0.1)
Basophils Relative: 1 % (ref 0–1)
Eosinophils Absolute: 0 10*3/uL (ref 0.0–0.7)
Eosinophils Relative: 1 % (ref 0–5)
Eosinophils Relative: 0 % (ref 0–5)
Lymphocytes Relative: 26 % (ref 12–46)
Lymphocytes Relative: 17 % (ref 12–46)
Lymphs Abs: 0.8 10*3/uL (ref 0.7–4.0)
Monocytes Absolute: 0.1 10*3/uL (ref 0.1–1.0)
Monocytes Absolute: 0.3 10*3/uL (ref 0.1–1.0)
Monocytes Relative: 2 % — ABNORMAL LOW (ref 3–12)
Neutro Abs: 4.4 10*3/uL (ref 1.7–7.7)

## 2010-07-11 LAB — CK TOTAL AND CKMB (NOT AT ARMC)
CK, MB: 0.6 ng/mL (ref 0.3–4.0)
CK, MB: 9.5 ng/mL — ABNORMAL HIGH (ref 0.3–4.0)

## 2010-07-11 LAB — TROPONIN I: Troponin I: 0.01 ng/mL (ref 0.00–0.06)

## 2010-07-11 LAB — BRAIN NATRIURETIC PEPTIDE: Pro B Natriuretic peptide (BNP): <30 pg/mL (ref 0.0–100.0)

## 2010-07-11 LAB — D-DIMER, QUANTITATIVE: D-Dimer, Quant: 1.19 ug/mL-FEU — ABNORMAL HIGH (ref 0.00–0.48)

## 2010-07-17 LAB — CBC
Hemoglobin: 13.9 g/dL (ref 12.0–15.0)
MCHC: 34.8 g/dL (ref 30.0–36.0)
MCV: 96 fL (ref 78.0–100.0)
RBC: 4.17 MIL/uL (ref 3.87–5.11)
RDW: 13.3 % (ref 11.5–15.5)

## 2010-08-24 NOTE — Op Note (Signed)
[name], [name]           ACCOUNT NO.:  [id number]   MEDICAL RECORD NO.:  [id number]          PATIENT TYPE:  OUT   LOCATION:  CARD                         FACILITY:  [facility]   PHYSICIAN:  Ollen Gross. Vernell Morgans, M.D. DATE OF BIRTH:  1955/10/21   DATE OF PROCEDURE:  11/01/2005  DATE OF DISCHARGE:                                 OPERATIVE REPORT   PREOPERATIVE DIAGNOSIS:  Right breast cancer.   POSTOPERATIVE DIAGNOSIS:  Right breast cancer.   PROCEDURE:  Placement of left subclavian vein Port-A-Cath.   SURGEON:  Ollen Gross. Carolynne Edouard, M.D.   ANESTHESIA:  General via LMA.   PROCEDURE:  After informed consent was obtained, the patient was brought to  the operating room and placed in the supine position on the operating table.  After adequate induction of general anesthesia, a roll was placed between  the patient's shoulders to extend the shoulder.  Her left chest was then  prepped with Betadine and draped in the usual sterile manner.  A small  incision was made just lateral to the bend of the clavicle with a 15 blade  knife.  A large bore finder needle from the Port-A-Cath kit was then used to  probe beneath the end of the clavicle aiming towards the sternal notch.  The  left subclavian vein was accessed without difficulty.  A wire was placed  through the needle using the Seldinger technique without difficulty.  The  needle was then removed.  Fluoroscopy was used to confirm that the wire was  in the central venous system.  The incision on the left chest wall was then  extended medially and laterally and a subcutaneous pocket was created by a  combination of blunt finger dissection and sharp dissection with the  electrocautery.  The well of the port with the tubing attached was then  placed in the pocket and the length of the tubing was estimated at about 18  cm and the tubing was cut at that point.  Next, a sheath and dilator were  placed over the wire using the Seldinger technique. The  wire and dilator  were then removed.  A thumb was placed over the opening of the sheath. The  tubing was then fed down the sheath as far as it could be fed and held in  place with blunt pickups. The sheath was then cracked and gently separated  keeping the tubing in place.  Another fluoro image was obtained that showed  the tip of the catheter to be in the superior vena cava.  At this point, the  tubing was anchored to the well of the catheter.  The well of the catheter  was then anchored in the pocket with two interrupted 2-0 Prolene stitches.  The catheter was then aspirated easily and then flushed initially with a  dilute heparin solution and then with a more concentrated heparin solution.  The deep portion of the wound was then closed with interrupted 3-0 Vicryl  stitches and the skin was closed with a running 4-0 Monocryl subcuticular  stitch.  Benzoin, Steri-Strips and sterile dressings were applied.  The  patient  tolerated the procedure well.  At the end of the case, all sponge,  needle, and instrument counts were correct.  The patient was then awakened  and taken to recovery in stable condition.      Ollen Gross. Vernell Morgans, M.D.  Electronically Signed    PST/MEDQ  D:  11/01/2005  T:  11/01/2005  Job:  [id number]

## 2010-08-24 NOTE — Op Note (Signed)
[name], [name]           ACCOUNT NO.:  [id number]   MEDICAL RECORD NO.:  [id number]          PATIENT TYPE:  AMB   LOCATION:  DAY                          FACILITY:  [facility]   PHYSICIAN:  Ollen Gross. Vernell Morgans, M.D. DATE OF BIRTH:  02-Nov-1955   DATE OF PROCEDURE:  05/27/2006  DATE OF DISCHARGE:                               OPERATIVE REPORT   PREOPERATIVE DIAGNOSIS:  Right breast cancer.   POSTOPERATIVE DIAGNOSIS:  Right breast cancer.   PROCEDURE:  Removal of left subclavian Port-A-Cath.   SURGEON:  Dr. Carolynne Edouard.   ANESTHESIA:  Local with IV sedation.   PROCEDURE:  After informed consent was obtained, the patient was brought  to the operating room, placed in a supine position on the operating  table.  After adequate IV sedation, the patient's left chest was prepped  with Betadine and draped in the usual sterile manner.  The area around  the port and was infiltrated with 1% lidocaine with epinephrine. A small  incision was made through her old previous incision for placement of the  port. This was done with a 15 blade knife.  This incision was carried  down through the skin and into the subcutaneous tissue sharply with a 15  blade knife.  Blunt hemostat dissection was then carried down in the  subcu tissue until the port was visualized. The two anchoring stitches  were grasped with a hemostat and the stitches were then cut with suture  scissors and the stitches were removed.  The capsule that port was  contained in was opened sharply with Metzenbaum scissors.  The port was  then, pushed out of the cavity and with gentle traction the port was  removed from the patient. Pressure was held on the tubing track for  several minutes until the area was completely hemostatic.  The deep  layer of the wound was then closed with interrupted 3-0 Vicryl stitches  and the skin was closed a running 4-0 Monocryl subcuticular stitch.  Benzoin, Steri-Strips and sterile dressings were applied.  The  patient  tolerated the procedure well. At the end of the case all needle, sponge  and instrument counts were correct.  The patient was then awakened and  taken to the recovery room in stable condition.      Ollen Gross. Vernell Morgans, M.D.  Electronically Signed     PST/MEDQ  D:  05/27/2006  T:  05/27/2006  Job:  [id number]

## 2010-08-24 NOTE — Op Note (Signed)
[name], [name]           ACCOUNT NO.:  [id number]   MEDICAL RECORD NO.:  [id number]          PATIENT TYPE:  AMB   LOCATION:  DSC                          FACILITY:  [facility]   PHYSICIAN:  Ollen Gross. Vernell Morgans, M.D. DATE OF BIRTH:  1956/01/13   DATE OF PROCEDURE:  09/25/2005  DATE OF DISCHARGE:  09/25/2005                                 OPERATIVE REPORT   PREOPERATIVE DIAGNOSIS:  Right breast cancer.   POSTOPERATIVE DIAGNOSIS:  Right breast cancer.   PROCEDURE:  Right needle localized lumpectomy with sentinel lymph node  biopsy and injection of blue dye.   SURGEON:  Dr. Carolynne Edouard.   ANESTHESIA:  General.   PROCEDURE:  After informed consent was obtained, the patient was brought to  the operating room and placed in a supine position on the operating table.  After adequate induction of general anesthesia, the patient's right chest,  breast and axilla were prepped with Betadine and draped in the usual sterile  manner.  The patient had earlier in the day undergone a needle localization  procedure.  The tumor itself was in the very upper outer quadrant of the  right breast.  The location of this is very close to the hot spot for a  sentinel node in the right axilla.  The patient had also earlier in the day  undergone an injection of a nuclear medicine tracer into the subareolar  position.  At this point a curvilinear incision was made above the entry  site of the wire.  This incision was carried down through the skin and  subcutaneous tissue sharply with electrocautery until the axilla was  entered.  There was a single hot spot measured with the NeoProbe low in the  right axilla, and the dissection to this spot was directed with the  NeoProbe.  Hemostat dissection was carried out in the right axilla until a  hot blue lymph node was identified.  This was dissected free sharply with  electrocautery, and then lymphatics were clamped with hemostats, divided and  ligated with 3-0 Vicryl  ties.  This sentinel lymph node was both hot and  blue and was sent as sentinel node #1.  A second lymph node was also  identified which was also both hot and blue, and it was dissected free in a  similar manner.  It was sent as sentinel node #2.  Prior to starting this, 2  mL of methylene blue and 3 mL of injectable saline were injected in the  subareolar position in the tissue, and the breast was massaged for several  minutes.  At this point the attention was then turned to the tumor itself.  The tumor was readily reachable through this same incision.  A spherical  portion of breast tissue surrounding the mass and the wire were removed  sharply with the electrocautery.  This dissection was done circumferentially  around the path of the wire and around the palpable mass.  Once this was  completely excised with what appeared to be good margins, it was oriented  with the long stitch being lateral and short stitch being superior and sent  to pathology for further evaluation.  The wound was irrigated with copious  amounts of saline.  The deep layer of the incision was then closed with  interrupted 3-0 Vicryl stitches, and the skin was closed with a running 4-0  Monocryl subcuticular stitch.  Benzoin, Steri-Strips and sterile dressings  were applied.  Touch preps of  the sentinel nodes and margins were negative.  The patient tolerated the  procedure well.  At the end of the case all needle, sponge and instrument  counts correct.  The patient was then awakened and taken recovery room in  stable condition.      Ollen Gross. Vernell Morgans, M.D.  Electronically Signed     PST/MEDQ  D:  10/11/2005  T:  10/11/2005  Job:  [id number]
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
## 2011-01-16 ENCOUNTER — Encounter: Payer: Self-pay | Admitting: Internal Medicine

## 2012-01-01 ENCOUNTER — Encounter: Payer: Self-pay | Admitting: Internal Medicine

## 2014-02-09 ENCOUNTER — Encounter: Payer: Self-pay | Admitting: Internal Medicine

## 2014-02-14 ENCOUNTER — Telehealth: Payer: Self-pay

## 2014-02-14 NOTE — Telephone Encounter (Signed)
Daughter called to let us know that Mother had died.  Verified with Social Security Death Site
